# Patient Record
Sex: Female | Born: 1960 | Race: White | Hispanic: No | Marital: Single | Smoking: Never smoker
Health system: Southern US, Community
[De-identification: ages and names within clinical notes are randomized; demographics above are authoritative.]

## PROBLEM LIST (undated history)

## (undated) DIAGNOSIS — N200 Calculus of kidney: Secondary | ICD-10-CM

## (undated) DIAGNOSIS — I10 Essential (primary) hypertension: Secondary | ICD-10-CM

## (undated) DIAGNOSIS — L570 Actinic keratosis: Secondary | ICD-10-CM

## (undated) DIAGNOSIS — J302 Other seasonal allergic rhinitis: Secondary | ICD-10-CM

## (undated) DIAGNOSIS — E7800 Pure hypercholesterolemia, unspecified: Secondary | ICD-10-CM

## (undated) HISTORY — DX: Pure hypercholesterolemia, unspecified: E78.00

## (undated) HISTORY — DX: Actinic keratosis: L57.0

## (undated) HISTORY — DX: Other seasonal allergic rhinitis: J30.2

## (undated) HISTORY — DX: Calculus of kidney: N20.0

## (undated) HISTORY — PX: TONSILLECTOMY AND ADENOIDECTOMY: SUR1326

## (undated) HISTORY — DX: Essential (primary) hypertension: I10

## (undated) HISTORY — PX: OTHER SURGICAL HISTORY: SHX169

---

## 2008-05-06 ENCOUNTER — Ambulatory Visit: Payer: Self-pay | Admitting: Family Medicine

## 2009-10-09 ENCOUNTER — Ambulatory Visit: Payer: Self-pay | Admitting: Family Medicine

## 2011-04-29 DIAGNOSIS — I2510 Atherosclerotic heart disease of native coronary artery without angina pectoris: Secondary | ICD-10-CM | POA: Insufficient documentation

## 2011-04-29 DIAGNOSIS — E7800 Pure hypercholesterolemia, unspecified: Secondary | ICD-10-CM | POA: Insufficient documentation

## 2011-04-29 DIAGNOSIS — I10 Essential (primary) hypertension: Secondary | ICD-10-CM | POA: Insufficient documentation

## 2011-04-29 DIAGNOSIS — Z87442 Personal history of urinary calculi: Secondary | ICD-10-CM | POA: Insufficient documentation

## 2011-06-07 DIAGNOSIS — K760 Fatty (change of) liver, not elsewhere classified: Secondary | ICD-10-CM | POA: Insufficient documentation

## 2011-08-11 IMAGING — CR DG CHEST 2V
1 series · 2 of 2 positions shown · non-contrast
Comparison: none

REASON FOR EXAM: bronchitis
COMMENTS:

PROCEDURE:     KDR - KDXR CHEST PA (OR AP) AND LAT  - October 09, 2009  [DATE]
RESULT:     No acute cardiopulmonary disease.

[Series 1: view not recorded · 0.17mm/px · 2 of 2 slices shown]
[im 1/2]
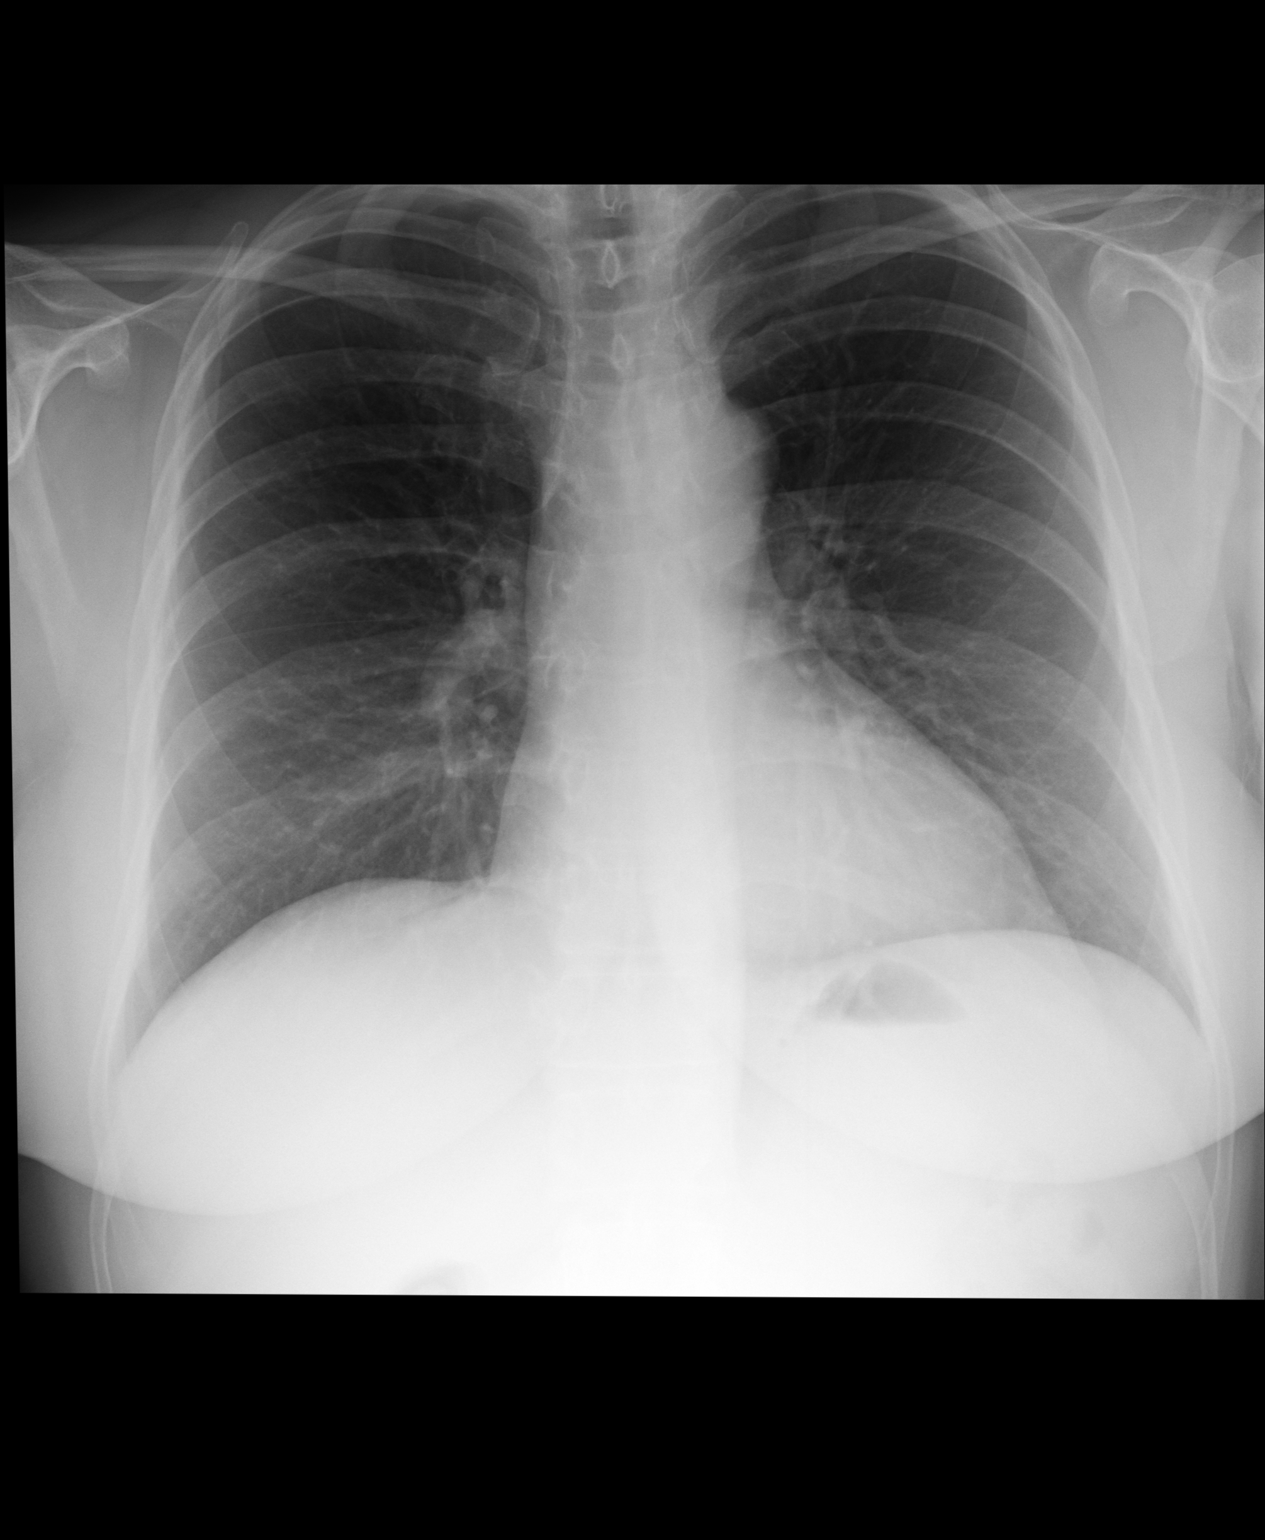
[im 2/2]
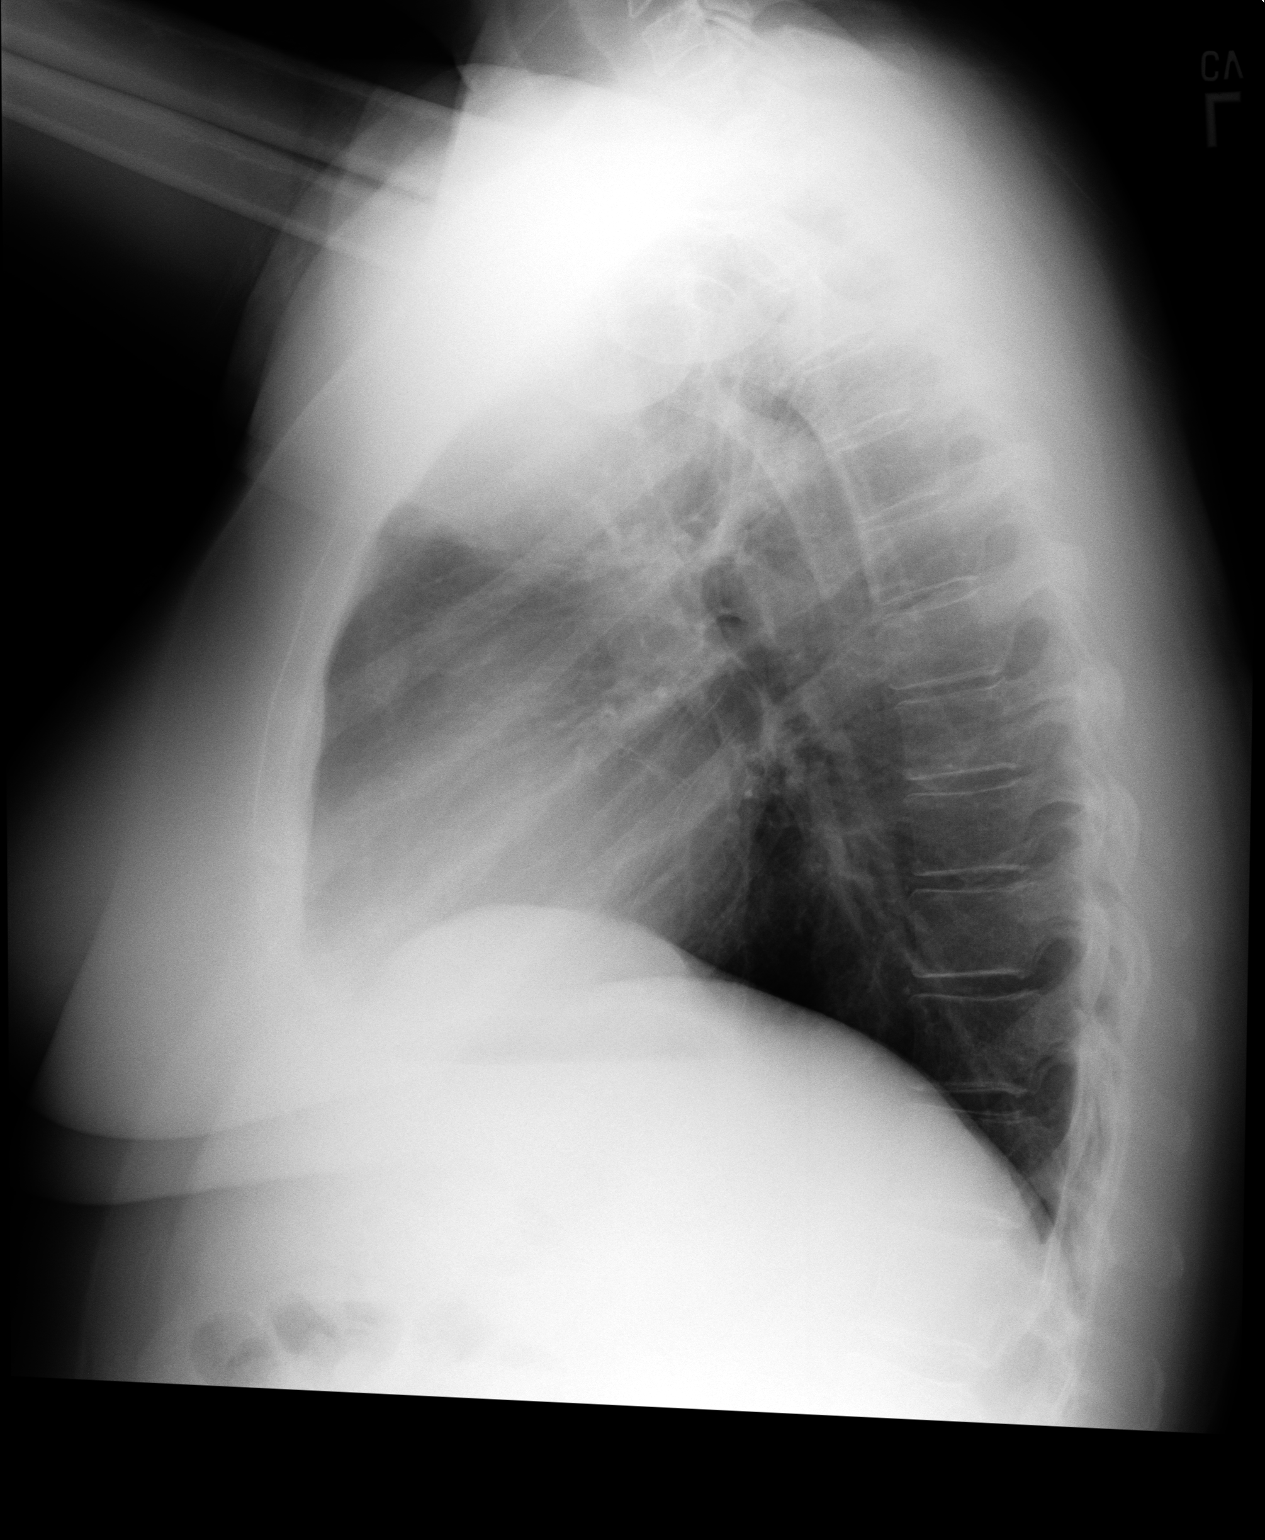

[2 of 2 positions shown; findings below may reference images not displayed]

IMPRESSION: No acute abnormality.

## 2012-04-27 DIAGNOSIS — M7710 Lateral epicondylitis, unspecified elbow: Secondary | ICD-10-CM | POA: Insufficient documentation

## 2012-12-11 ENCOUNTER — Encounter: Payer: Self-pay | Admitting: Internal Medicine

## 2013-11-26 ENCOUNTER — Ambulatory Visit (INDEPENDENT_AMBULATORY_CARE_PROVIDER_SITE_OTHER): Payer: BC Managed Care – PPO

## 2013-11-26 ENCOUNTER — Encounter: Payer: Self-pay | Admitting: Podiatry

## 2013-11-26 ENCOUNTER — Ambulatory Visit (INDEPENDENT_AMBULATORY_CARE_PROVIDER_SITE_OTHER): Payer: BC Managed Care – PPO | Admitting: Podiatry

## 2013-11-26 VITALS — Resp 16 | Ht 66.0 in | Wt 155.0 lb

## 2013-11-26 DIAGNOSIS — M6740 Ganglion, unspecified site: Secondary | ICD-10-CM

## 2013-11-26 DIAGNOSIS — M779 Enthesopathy, unspecified: Secondary | ICD-10-CM

## 2013-11-26 DIAGNOSIS — L02619 Cutaneous abscess of unspecified foot: Secondary | ICD-10-CM

## 2013-11-26 DIAGNOSIS — M79609 Pain in unspecified limb: Secondary | ICD-10-CM

## 2013-11-26 DIAGNOSIS — M79673 Pain in unspecified foot: Secondary | ICD-10-CM

## 2013-11-26 DIAGNOSIS — L03119 Cellulitis of unspecified part of limb: Secondary | ICD-10-CM

## 2013-11-26 MED ORDER — SULFAMETHOXAZOLE-TMP DS 800-160 MG PO TABS: 1.0000 | ORAL_TABLET | Freq: Two times a day (BID) | ORAL | Status: DC

## 2013-11-26 MED ORDER — CIPROFLOXACIN HCL 500 MG PO TABS: 500.0000 mg | ORAL_TABLET | Freq: Two times a day (BID) | ORAL | Status: DC

## 2013-11-26 NOTE — Progress Notes (Signed)
   Subjective:    Patient ID: Taylor Garrett, female    DOB: 22-Jan-1961, 53 y.o.   MRN: [id number]  HPI Comments: N pain L rt foot under 4th digit  D stepped on rake wed 4.8.15 O sudden C worse/better A standing, walking T peroxide, elevates, stay off of it, ibuprofen   N 0 L 3RD DIGIT LEFT D 1.5 -2 YRS O SLOWLY C WORSE A 0 T STUCK NEEDLE IN TOE   N PAIN L B/L FOOT PAIN-PLANTAR D 2 - 3 M O SLOWLY C WORSE A STANDING, BAREFOOT T IBUPROFEN, INSERTS  Foot Pain Associated symptoms include abdominal pain, coughing, fatigue and nausea.      Review of Systems  Constitutional: Positive for fatigue.  HENT: Negative.   Eyes: Negative.   Respiratory: Positive for cough, chest tightness and shortness of breath.   Cardiovascular: Negative.   Gastrointestinal: Positive for nausea, abdominal pain and constipation.       BLOATING  Endocrine:       BLOOD IN URINE  Musculoskeletal:       JOINT PAIN  Skin: Negative.   Allergic/Immunologic: Positive for environmental allergies.  Neurological: Negative.   Hematological: Bruises/bleeds easily.  Psychiatric/Behavioral: Negative.        Objective:   Physical Exam        Assessment & Plan:

## 2013-11-26 NOTE — Progress Notes (Signed)
Subjective:     Patient ID: Taylor Garrett, female   DOB: 04/07/1961, 53 y.o.   MRN: [id number]  Foot Pain   patient presents stating I stepped on a rake 2 days ago on my right foot and it went through my shoe and it penetrated the plantar of my foot right. States that she clean the area with hydrogen peroxide and has been sore since and she states that she has had her tetanus booster in the last 2 years. Also complains of a small mass on the left third toe and also a forefoot pain in general for which orthotics been helpful in the past and are no longer holding them up well   Review of Systems  All other systems reviewed and are negative.      Objective:   Physical Exam  Nursing note and vitals reviewed. Constitutional: She is oriented to person, place, and time.  Cardiovascular: Intact distal pulses.   Musculoskeletal: Normal range of motion.  Neurological: She is oriented to person, place, and time.  Skin: Skin is warm.   neurovascular found to be intact with muscle strength adequate and range of motion normal subtalar midtarsal joint. There is a small point of entry around the third metatarsal plantar right foot with no drainage noted and no significant edema erythema surrounding the area. There is some mild discomfort surrounding the area and is localized in nature. Small mass third digit left foot inner phalangeal joint which appears to be a cyst    Assessment:      no Indications of infection but is possible do to the introduction of foreign body to the plantar right foot. Left third toe shows a small cyst which appears to be mucoid and there is inflammation in the lesser MPJs consistent with capsulitis    Plan:     H&P and x-rays reviewed. For the right foot we reviewed x-ray and is precautionary measure I placed her on Septra DS and Cipro with strict instructions of increased redness any streaking border worker or increased pain to go straight to the emergency room. The left  proximal nerve block I then aspirated the area was able to get a small amount of gelatinous material and applied compression dressing. I then scanned for custom orthotics to reduce pressure against the plantar feet

## 2013-12-10 ENCOUNTER — Encounter: Payer: Self-pay | Admitting: Podiatry

## 2013-12-14 ENCOUNTER — Ambulatory Visit (INDEPENDENT_AMBULATORY_CARE_PROVIDER_SITE_OTHER): Payer: BC Managed Care – PPO | Admitting: Podiatry

## 2013-12-14 VITALS — Resp 16 | Ht 66.0 in | Wt 150.0 lb

## 2013-12-14 DIAGNOSIS — L02619 Cutaneous abscess of unspecified foot: Secondary | ICD-10-CM

## 2013-12-14 DIAGNOSIS — L923 Foreign body granuloma of the skin and subcutaneous tissue: Secondary | ICD-10-CM

## 2013-12-14 DIAGNOSIS — L03119 Cellulitis of unspecified part of limb: Principal | ICD-10-CM

## 2013-12-14 NOTE — Progress Notes (Signed)
Subjective:     Patient ID: Taylor Garrett, female   DOB: Nov 16, 1960, 53 y.o.   MRN: [id number]  HPI patient presents to pick up orthotics stating that there is a new spot on her right foot that she is concerned could be related to the infection she had had. Stated her forefoot is feeling much better but she only took a couple days of antibiotics as her mother passed away and she need to be on her foot  Review of Systems     Objective:   Physical Exam Vascular status intact with no change in health history and a small open area in the right proximal arch measuring approximately 2 x 2 mm with no erythema edema or drainage surrounding it    Assessment:     Seems to be a coincidence area does not appear to be involved with the original foreign body or infection of the forefoot right    Plan:     Orthotics dispensed with instructions and soaks will be done to the right foot with Band-Aid treatment and if any issues should occur or any redness swelling or pain patient is to inform us are go to the emergency room

## 2013-12-14 NOTE — Patient Instructions (Signed)

## 2014-01-07 ENCOUNTER — Ambulatory Visit: Payer: BC Managed Care – PPO | Admitting: Podiatry

## 2014-12-28 DIAGNOSIS — M170 Bilateral primary osteoarthritis of knee: Secondary | ICD-10-CM | POA: Insufficient documentation

## 2016-01-23 DIAGNOSIS — R1319 Other dysphagia: Secondary | ICD-10-CM | POA: Insufficient documentation

## 2016-01-23 DIAGNOSIS — R12 Heartburn: Secondary | ICD-10-CM | POA: Insufficient documentation

## 2016-02-01 DIAGNOSIS — Z789 Other specified health status: Secondary | ICD-10-CM | POA: Insufficient documentation

## 2016-04-02 ENCOUNTER — Ambulatory Visit (INDEPENDENT_AMBULATORY_CARE_PROVIDER_SITE_OTHER): Payer: BLUE CROSS/BLUE SHIELD

## 2016-04-02 ENCOUNTER — Encounter: Payer: Self-pay | Admitting: Podiatry

## 2016-04-02 ENCOUNTER — Ambulatory Visit (INDEPENDENT_AMBULATORY_CARE_PROVIDER_SITE_OTHER): Payer: BLUE CROSS/BLUE SHIELD | Admitting: Podiatry

## 2016-04-02 DIAGNOSIS — M779 Enthesopathy, unspecified: Secondary | ICD-10-CM

## 2016-04-02 DIAGNOSIS — M2021 Hallux rigidus, right foot: Secondary | ICD-10-CM | POA: Diagnosis not present

## 2016-04-02 DIAGNOSIS — M722 Plantar fascial fibromatosis: Secondary | ICD-10-CM | POA: Diagnosis not present

## 2016-04-02 DIAGNOSIS — M069 Rheumatoid arthritis, unspecified: Secondary | ICD-10-CM

## 2016-04-02 DIAGNOSIS — R52 Pain, unspecified: Secondary | ICD-10-CM

## 2016-04-02 MED ORDER — MELOXICAM 15 MG PO TABS: 15.0000 mg | ORAL_TABLET | Freq: Every day | ORAL | 2 refills | Status: DC

## 2016-04-02 NOTE — Progress Notes (Signed)
   Subjective:    Patient ID: Eloisa Northern, female    DOB: 1961-06-05, 55 y.o.   MRN: [id number]  HPI  55 year old female presents the office they for concerns of right foot pain along her big toe as well as her pain in the bottoms of both of her feet along the arch. She describes as an aching sensation to the bottom of her feet. She denies any recent injury or trauma. No swelling or redness. The pain does not wake her up in a. The majority of pain is when she is walking. No other complaints at this time.  Review of Systems  All other systems reviewed and are negative.      Objective:   Physical Exam General: AAO x3, NAD  Dermatological: Skin is warm, dry and supple bilateral. Nails x 10 are well manicured; remaining integument appears unremarkable at this time. There are no open sores, no preulcerative lesions, no rash or signs of infection present.  Vascular: Dorsalis Pedis artery and Posterior Tibial artery pedal pulses are 2/4 bilateral with immedate capillary fill time. Pedal hair growth present. There is no pain with calf compression, swelling, warmth, erythema.   Neruologic: Grossly intact via light touch bilateral. Vibratory intact via tuning fork bilateral. Protective threshold with Semmes Wienstein monofilament intact to all pedal sites bilateral.   Musculoskeletal: Decreased range of motion the right first MTPJ and is decreased range of motion the first MTPJ. There is trace edema along this area any erythema or increase in warmth. Mild discomfort on the plantar medial band of the plantar fascia and the arch of the feet bilaterally. There is no other areas of tenderness bilaterally. No specific area pinpoint bony tenderness or pain the vibratory sensation. MMT 5/5. Range of motion intact otherwise.      Assessment & Plan:  55 year old female right hallux rigidus, capsulitis; tendinitis -Treatment options discussed including all alternatives, risks, and  complications -Etiology of symptoms were discussed -X-rays were obtained and reviewed with the patient. 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 No evidence of acute fractures about this time. -Discussed a steroid injection of the right first MTPJ. She wishes to proceed. Under sterile conditions a mixture of dexamethasone and local anesthetic was infiltrated without complications. Post injection care was discussed. -Prescribed mobic. Discussed side effects of the medication and directed to stop if any are to occur and call the office.  -Stretching exercises discussed. -Ice to the area -In general she states that she has had multiple much stiffness to a lot of other joints as well as arthritis and should to get tested for arthritis. Ordered arthritic panel. 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 -Follow up as scheduled or sooner if needed.  Celesta Gentile, DPM

## 2016-04-10 ENCOUNTER — Other Ambulatory Visit: Payer: Self-pay | Admitting: Podiatry

## 2016-04-10 ENCOUNTER — Telehealth: Payer: Self-pay | Admitting: *Deleted

## 2016-04-10 ENCOUNTER — Ambulatory Visit: Payer: Self-pay | Admitting: Podiatry

## 2016-04-10 MED ORDER — METHYLPREDNISOLONE 4 MG PO TBPK: ORAL_TABLET | ORAL | 0 refills | Status: DC

## 2016-04-10 NOTE — Telephone Encounter (Addendum)
Pt states the Meloxicam is not really helping the pain in her feet and she wanted to know if Dr. Jacqualyn Posey would order a steroid pack. 04/11/2016-Informed pt the Medrol dose pack was at her pharmacy, and to stop the Mobic. Pt states she had stopped the Mobic days ago, and had begun Ibuprofen which works better for her, and she requested a rx for Ibuprofen 800mg , because she will be on vacation when she completes the dose pack. Dr. Jacqualyn Posey ordered Ibuprofen 800mg  #90 1 tablet tid, +2refills.

## 2016-04-10 NOTE — Telephone Encounter (Signed)
I went ahead and ordered this for her. Stop mobic.

## 2016-04-11 MED ORDER — IBUPROFEN 800 MG PO TABS: 800.0000 mg | ORAL_TABLET | Freq: Three times a day (TID) | ORAL | 2 refills | Status: AC | PRN

## 2019-12-02 ENCOUNTER — Ambulatory Visit: Payer: BC Managed Care – PPO | Admitting: Dermatology

## 2019-12-02 ENCOUNTER — Other Ambulatory Visit: Payer: Self-pay

## 2019-12-02 DIAGNOSIS — D1800 Hemangioma unspecified site: Secondary | ICD-10-CM

## 2019-12-02 DIAGNOSIS — D485 Neoplasm of uncertain behavior of skin: Secondary | ICD-10-CM | POA: Diagnosis not present

## 2019-12-02 DIAGNOSIS — M67472 Ganglion, left ankle and foot: Secondary | ICD-10-CM

## 2019-12-02 DIAGNOSIS — L570 Actinic keratosis: Secondary | ICD-10-CM

## 2019-12-02 DIAGNOSIS — D229 Melanocytic nevi, unspecified: Secondary | ICD-10-CM

## 2019-12-02 DIAGNOSIS — L820 Inflamed seborrheic keratosis: Secondary | ICD-10-CM | POA: Diagnosis not present

## 2019-12-02 DIAGNOSIS — Z872 Personal history of diseases of the skin and subcutaneous tissue: Secondary | ICD-10-CM

## 2019-12-02 DIAGNOSIS — D224 Melanocytic nevi of scalp and neck: Secondary | ICD-10-CM | POA: Diagnosis not present

## 2019-12-02 DIAGNOSIS — L821 Other seborrheic keratosis: Secondary | ICD-10-CM

## 2019-12-02 DIAGNOSIS — Z1283 Encounter for screening for malignant neoplasm of skin: Secondary | ICD-10-CM

## 2019-12-02 DIAGNOSIS — D489 Neoplasm of uncertain behavior, unspecified: Secondary | ICD-10-CM

## 2019-12-02 DIAGNOSIS — D2272 Melanocytic nevi of left lower limb, including hip: Secondary | ICD-10-CM

## 2019-12-02 DIAGNOSIS — L578 Other skin changes due to chronic exposure to nonionizing radiation: Secondary | ICD-10-CM

## 2019-12-02 DIAGNOSIS — M67449 Ganglion, unspecified hand: Secondary | ICD-10-CM

## 2019-12-02 DIAGNOSIS — I781 Nevus, non-neoplastic: Secondary | ICD-10-CM

## 2019-12-02 DIAGNOSIS — L814 Other melanin hyperpigmentation: Secondary | ICD-10-CM

## 2019-12-02 NOTE — Progress Notes (Signed)
Follow-Up Visit   Subjective  Taylor Garrett is a 59 y.o. female who presents for the following: Annual Exam (TBSE).  Patient is here for skin cancer screening and mole check.   She has itchy places on back all over, has some scaly places b/l legs, pink scaly place on left flank that is itch, and dark places on right leg on back of thigh about a year. They all tend to look scaly and dry.  The following portions of the chart were reviewed this encounter and updated as appropriate: Allergies  Meds  Problems  Med Hx  Surg Hx  Fam Hx     Review of Systems: No other skin or systemic complaints.  Objective  Well appearing patient in no apparent distress; mood and affect are within normal limits.  A full examination was performed including scalp, head, eyes, ears, nose, lips, neck, chest, axillae, abdomen, back, buttocks, bilateral upper extremities, bilateral lower extremities, hands, feet, fingers, toes, fingernails, and toenails. All findings within normal limits unless otherwise noted below.  Objective  Chest - Medial [facility]), Left  shin, Left Breast, Left Forearm - Anterior, Left Upper Back: Erythematous thin papules/macules with gritty scale. Erythematous thin papules/macules with gritty scale.   Objective  Left 3rd Toe: Translucent papule  Objective  Head - Anterior (Face), Left Upper Back: Right eyebrow clear No evidence of return Left upper back, clear No evidence of return  Objective  Right Lower Back: 0.4 cm red papule       Objective  Right Parietal Scalp: 0.7cm x 0.4 cm pink papule  Assessment & Plan  AK (actinic keratosis) (5) Left Forearm - Anterior; Left  shin; Chest - Medial [facility]); Left Breast; Left Upper Back  Destruction of lesion - Chest - Medial [facility]), Left  shin, Left Breast, Left Forearm - Anterior, Left Upper Back  Destruction method: cryotherapy   Informed consent: discussed and consent obtained   Lesion destroyed using  liquid nitrogen: Yes   Outcome: patient tolerated procedure well with no complications   Post-procedure details: wound care instructions given    Digital mucous cyst Left 3rd Toe  Benign-appearing.  Observation.     History of actinic keratoses (2) Head - Anterior (Face); Left Upper Back  Clear. Observe for recurrence. Call clinic for new or changing lesions.  Recommend regular skin exams, daily broad-spectrum spf 30+ sunscreen use, and photoprotection.     Inflamed seborrheic keratosis (5) Right Hip (side) - Posterior; Right Thigh - Posterior (2); Left Flank; Left Upper Back  Destruction of lesion - Left Flank, Left Upper Back, Right Hip (side) - Posterior, Right Thigh - Posterior (2)  Destruction method: cryotherapy   Informed consent: discussed and consent obtained   Lesion destroyed using liquid nitrogen: Yes   Outcome: patient tolerated procedure well with no complications   Post-procedure details: wound care instructions given    Neoplasm of uncertain behavior Right Lower Back  Epidermal / dermal shaving  Lesion length (cm):  0.4 Lesion width (cm):  0.4 Margin per side (cm):  0.1 Total excision diameter (cm):  0.6 Informed consent: discussed and consent obtained   Timeout: patient name, date of birth, surgical site, and procedure verified   Anesthesia: the lesion was anesthetized in a standard fashion   Anesthetic:  1% lidocaine w/ epinephrine 1-100,000 buffered w/ 8.4% NaHCO3 Instrument used: flexible razor blade   Outcome: patient tolerated procedure well   Post-procedure details: sterile dressing applied and wound care instructions given   Dressing  type: petrolatum and pressure dressing    Specimen 1 - Surgical pathology Differential Diagnosis: R/O irritated Hemangioma vs other Check Margins: No 0.4 cm red papule  R/O irritated Hemangioma vs other  Nevus Right Parietal Scalp  Benign-appearing.  Observation.  Call clinic for new or changing moles.   Recommend daily use of broad spectrum spf 30+ sunscreen to sun-exposed areas.   Actinic Damage - diffuse scaly erythematous macules with underlying dyspigmentation - Recommend daily broad spectrum sunscreen SPF 30+ to sun-exposed areas, reapply every 2 hours as needed.  - Call for new or changing lesions.  Lentigines - Scattered tan macules - Discussed due to sun exposure - Benign, observe - Call for any changes  Seborrheic Keratoses - Stuck-on, waxy, tan-brown papules and plaques  - Discussed benign etiology and prognosis. - Observe - Call for any changes  Hemangiomas - Red papules - Discussed benign nature - Observe - Call for any changes  Melanocytic Nevi - Tan-brown and/or pink-flesh-colored symmetric macules and papules - Benign appearing on exam today - Observation - Call clinic for new or changing moles - Recommend daily use of broad spectrum spf 30+ sunscreen to sun-exposed areas.  0.5cm thin brown papule, benign appearing left plantar ball of foot  Telangiectasia - left cheek - Dilated blood vessel - Benign appearing on exam - Call for changes  Skin cancer screening performed today.  Return in about 1 year (around 12/01/2020) for TBSE.   I, Donzetta Kohut, CMA, am acting as scribe for Forest Gleason, MD .  Documentation: I have reviewed the above documentation for accuracy and completeness, and I agree with the above.  Forest Gleason, MD

## 2019-12-02 NOTE — Patient Instructions (Addendum)
Liquid nitrogen was applied for 10-12 seconds to the skin lesion and the expected blistering or scabbing reaction explained. Do not pick at the area. Patient reminded to expect hypopigmented scars from the procedure. Return if lesion fails to fully resolve.  Recommend daily broad spectrum sunscreen SPF 30+ to sun-exposed areas, reapply every 2 hours as needed. Call for new or changing lesions.  Wound Care Instructions  1. Cleanse wound gently with soap and water once a day then pat dry with clean gauze. Apply a thing coat of Petrolatum (petroleum jelly, "Vaseline") over the wound (unless you have an allergy to this). We recommend that you use a new, sterile tube of Vaseline. Do not pick or remove scabs. Do not remove the yellow or white "healing tissue" from the base of the wound.  2. Cover the wound with fresh, clean, nonstick gauze and secure with paper tape. You may use Band-Aids in place of gauze and tape if the would is small enough, but would recommend trimming much of the tape off as there is often too much. Sometimes Band-Aids can irritate the skin.  3. You should call the office for your biopsy report after 1 week if you have not already been contacted.  4. If you experience any problems, such as abnormal amounts of bleeding, swelling, significant bruising, significant pain, or evidence of infection, please call the office immediately.  5. FOR ADULT SURGERY PATIENTS: If you need something for pain relief you may take 1 extra strength Tylenol (acetaminophen) AND 2 Ibuprofen (200mg  each) together every 4 hours as needed for pain. (do not take these if you are allergic to them or if you have a reason you should not take them.) Typically, you may only need pain medication for 1 to 3 days.

## 2019-12-08 ENCOUNTER — Telehealth: Payer: Self-pay

## 2019-12-08 NOTE — Telephone Encounter (Signed)
Patient was called and informed of biopsy results. Patient verbalized understanding.

## 2019-12-08 NOTE — Progress Notes (Signed)
Skin , right lower back HEMANGIOMA  This is a benign blood vessel growth. No additional treatment is needed.

## 2019-12-16 ENCOUNTER — Encounter: Payer: Self-pay | Admitting: Dermatology

## 2020-03-27 ENCOUNTER — Telehealth: Payer: Self-pay

## 2020-03-27 NOTE — Telephone Encounter (Signed)
Patient called to schedule a surgery with you. She states you have frozen a spot on the bottom of her right foot several times and states that if it returns problematic again you will need to do a little procedure on her?   I did not see anything per her last note but I did schedule her in a surgery slot for September 1st. I wanted you to be aware.

## 2020-03-28 NOTE — Telephone Encounter (Signed)
I reviewed patient's records in [place] and it looks like she has a history of a lesion that could be a corn, foreign body or verruca.  Please switch her to a regular clinic visit and I can biopsy at that visit if needed. Thank you!

## 2020-03-30 NOTE — Telephone Encounter (Signed)
Appointment has been fixed and patient made aware.

## 2020-04-06 ENCOUNTER — Other Ambulatory Visit: Payer: Self-pay

## 2020-04-06 ENCOUNTER — Ambulatory Visit: Payer: BC Managed Care – PPO | Admitting: Dermatology

## 2020-04-06 DIAGNOSIS — L814 Other melanin hyperpigmentation: Secondary | ICD-10-CM | POA: Diagnosis not present

## 2020-04-06 DIAGNOSIS — D492 Neoplasm of unspecified behavior of bone, soft tissue, and skin: Secondary | ICD-10-CM

## 2020-04-06 DIAGNOSIS — L820 Inflamed seborrheic keratosis: Secondary | ICD-10-CM

## 2020-04-06 DIAGNOSIS — D099 Carcinoma in situ, unspecified: Secondary | ICD-10-CM

## 2020-04-06 DIAGNOSIS — L821 Other seborrheic keratosis: Secondary | ICD-10-CM | POA: Diagnosis not present

## 2020-04-06 DIAGNOSIS — D485 Neoplasm of uncertain behavior of skin: Secondary | ICD-10-CM | POA: Diagnosis not present

## 2020-04-06 HISTORY — DX: Carcinoma in situ, unspecified: D09.9

## 2020-04-06 MED ORDER — MUPIROCIN 2 % EX OINT: 1.0000 "application " | TOPICAL_OINTMENT | Freq: Every day | CUTANEOUS | 0 refills | Status: DC

## 2020-04-06 MED ORDER — TRIAMCINOLONE ACETONIDE 0.1 % EX CREA: 1.0000 "application " | TOPICAL_CREAM | Freq: Two times a day (BID) | CUTANEOUS | 2 refills | Status: DC | PRN

## 2020-04-06 NOTE — Progress Notes (Signed)
   Follow-Up Visit   Subjective  Taylor Garrett "Taylor Garrett" is a 59 y.o. female who presents for the following: lesions (Neck, itching. ), Seborrheic Keratosis (back, itching, irritated), and Warts (right plantar foot, Hx of LN2 treatment).  The following portions of the chart were reviewed this encounter and updated as appropriate: Allergies  Meds  Problems  Med Hx  Surg Hx  Fam Hx      Review of Systems: No other skin or systemic complaints except as noted in HPI or Assessment and Plan.  Objective  Well appearing patient in no apparent distress; mood and affect are within normal limits.  A focused examination was performed including face, neck, back, right foot. Relevant physical exam findings are noted in the Assessment and Plan.  Objective  Back and shoulders x 21, neck x1 (22): Erythematous keratotic or waxy stuck-on papule or plaque.   Objective  Left Neck - Anterior: 0.4cm erythematous papule     Objective  Right Plantar Surface: 1.0cm thin scaly pink plaque     Assessment & Plan  Inflamed seborrheic keratosis (22) Back and shoulders x 21, neck x1  Destruction of lesion - Back and shoulders x 21, neck x1  Destruction method: cryotherapy   Informed consent: discussed and consent obtained   Lesion destroyed using liquid nitrogen: Yes   Outcome: patient tolerated procedure well with no complications   Post-procedure details: wound care instructions given    triamcinolone cream (KENALOG) 0.1 % - Back and shoulders x 21, neck x1  Neoplasm of skin (2) Left Neck - Anterior  Skin / nail biopsy Type of biopsy: tangential   Informed consent: discussed and consent obtained   Anesthesia: the lesion was anesthetized in a standard fashion   Anesthesia comment:  Area prepped with alcohol Anesthetic:  1% lidocaine w/ epinephrine 1-100,000 buffered w/ 8.4% NaHCO3 Instrument used: flexible razor blade   Hemostasis achieved with: pressure, aluminum chloride and  electrodesiccation   Outcome: patient tolerated procedure well   Post-procedure details: wound care instructions given   Post-procedure details comment:  Ointment and small bandage applied  Specimen 1 - Surgical pathology Differential Diagnosis: R/O irritated nevus vs ISK vs other Check Margins: No 0.4cm erythematous papule  Right Plantar Surface  Skin / nail biopsy Type of biopsy: tangential   Informed consent: discussed and consent obtained   Anesthesia: the lesion was anesthetized in a standard fashion   Anesthesia comment:  Area prepped with alcohol Anesthetic:  1% lidocaine w/ epinephrine 1-100,000 buffered w/ 8.4% NaHCO3 Instrument used: flexible razor blade   Hemostasis achieved with: pressure, aluminum chloride and electrodesiccation   Outcome: patient tolerated procedure well   Post-procedure details: wound care instructions given   Post-procedure details comment:  Ointment and small bandage applied  mupirocin ointment (BACTROBAN) 2 %  Specimen 2 - Surgical pathology Differential Diagnosis: R/O foreign body granuloma > verruca vs other Check Margins: No 1.0cm thin scaly pink plaque  Seborrheic Keratoses - Stuck-on, waxy, tan-brown papules and plaques  - Discussed benign etiology and prognosis. - Observe - Call for any changes  Lentigines - Scattered tan macules - Discussed due to sun exposure - Benign, observe - Call for any changes  Return for scheduled appointment.   I, Emelia Salisbury, CMA, am acting as scribe for Forest Gleason, MD.  Documentation: I have reviewed the above documentation for accuracy and completeness, and I agree with the above.  Forest Gleason, MD

## 2020-04-06 NOTE — Patient Instructions (Signed)
Wound Care Instructions  1. Cleanse wound gently with soap and water once a day then pat dry with clean gauze. Apply a thing coat of Petrolatum (petroleum jelly, "Vaseline") over the wound (unless you have an allergy to this). We recommend that you use a new, sterile tube of Vaseline. Do not pick or remove scabs. Do not remove the yellow or white "healing tissue" from the base of the wound.  2. Cover the wound with fresh, clean, nonstick gauze and secure with paper tape. You may use Band-Aids in place of gauze and tape if the would is small enough, but would recommend trimming much of the tape off as there is often too much. Sometimes Band-Aids can irritate the skin.  3. You should call the office for your biopsy report after 1 week if you have not already been contacted.  4. If you experience any problems, such as abnormal amounts of bleeding, swelling, significant bruising, significant pain, or evidence of infection, please call the office immediately.  5. FOR ADULT SURGERY PATIENTS: If you need something for pain relief you may take 1 extra strength Tylenol (acetaminophen) AND 2 Ibuprofen (200mg each) together every 4 hours as needed for pain. (do not take these if you are allergic to them or if you have a reason you should not take them.) Typically, you may only need pain medication for 1 to 3 days.   Cryotherapy Aftercare  . Wash gently with soap and water everyday.   . Apply Vaseline and Band-Aid daily until healed.  Prior to procedure, discussed risks of blister formation, small wound, skin dyspigmentation, or rare scar following cryotherapy.   

## 2020-04-13 ENCOUNTER — Encounter: Payer: Self-pay | Admitting: Dermatology

## 2020-04-13 NOTE — Progress Notes (Signed)
1. Skin , left neck - anterior SEBORRHEIC KERATOSIS, IRRITATED  2. 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 Will refer to Dr. Manley Mason at [facility] results and treatment with Mohs. Patient is in agreement.

## 2020-04-18 ENCOUNTER — Other Ambulatory Visit: Payer: Self-pay

## 2020-04-18 DIAGNOSIS — C44721 Squamous cell carcinoma of skin of unspecified lower limb, including hip: Secondary | ICD-10-CM

## 2020-04-18 NOTE — Progress Notes (Signed)
Referral to Dr. Manley Mason for [facility].

## 2020-04-19 ENCOUNTER — Encounter: Payer: BC Managed Care – PPO | Admitting: Dermatology

## 2020-04-20 ENCOUNTER — Telehealth: Payer: Self-pay

## 2020-04-20 NOTE — Telephone Encounter (Signed)
Pt called to check on status of her referral to [facility] for [facility] surgery. I informed pt that referral was placed and that we would check with coordinator for an update.

## 2020-04-26 ENCOUNTER — Ambulatory Visit: Payer: BC Managed Care – PPO | Admitting: Dermatology

## 2020-05-04 ENCOUNTER — Telehealth: Payer: Self-pay | Admitting: *Deleted

## 2020-05-04 NOTE — Telephone Encounter (Signed)
"  I'm calling to talk to someone about getting an office note from a previous visit.  I have to have some mole surgery on the bottom of one of my feet.  I want to get a note where I came in originally for a wound on my foot, just for the doctor.  Call and I will give you more information."

## 2020-05-12 NOTE — Telephone Encounter (Signed)
I am returning your call.  "I've already come over there and got my notes.  Cancer has showed up in a wound that you all had taken care of before.  I've already got it."

## 2020-07-27 ENCOUNTER — Ambulatory Visit: Payer: BC Managed Care – PPO | Admitting: Dermatology

## 2020-07-27 ENCOUNTER — Other Ambulatory Visit: Payer: Self-pay

## 2020-07-27 DIAGNOSIS — L820 Inflamed seborrheic keratosis: Secondary | ICD-10-CM

## 2020-07-27 DIAGNOSIS — D489 Neoplasm of uncertain behavior, unspecified: Secondary | ICD-10-CM

## 2020-07-27 DIAGNOSIS — D229 Melanocytic nevi, unspecified: Secondary | ICD-10-CM

## 2020-07-27 DIAGNOSIS — D224 Melanocytic nevi of scalp and neck: Secondary | ICD-10-CM

## 2020-07-27 DIAGNOSIS — Z1283 Encounter for screening for malignant neoplasm of skin: Secondary | ICD-10-CM

## 2020-07-27 DIAGNOSIS — B079 Viral wart, unspecified: Secondary | ICD-10-CM | POA: Diagnosis not present

## 2020-07-27 DIAGNOSIS — Z86007 Personal history of in-situ neoplasm of skin: Secondary | ICD-10-CM | POA: Diagnosis not present

## 2020-07-27 DIAGNOSIS — C44712 Basal cell carcinoma of skin of right lower limb, including hip: Secondary | ICD-10-CM | POA: Diagnosis not present

## 2020-07-27 DIAGNOSIS — L814 Other melanin hyperpigmentation: Secondary | ICD-10-CM

## 2020-07-27 DIAGNOSIS — L821 Other seborrheic keratosis: Secondary | ICD-10-CM

## 2020-07-27 DIAGNOSIS — D485 Neoplasm of uncertain behavior of skin: Secondary | ICD-10-CM | POA: Diagnosis not present

## 2020-07-27 DIAGNOSIS — L578 Other skin changes due to chronic exposure to nonionizing radiation: Secondary | ICD-10-CM

## 2020-07-27 DIAGNOSIS — D492 Neoplasm of unspecified behavior of bone, soft tissue, and skin: Secondary | ICD-10-CM

## 2020-07-27 DIAGNOSIS — D1800 Hemangioma unspecified site: Secondary | ICD-10-CM

## 2020-07-27 MED ORDER — MUPIROCIN 2 % EX OINT: 1.0000 "application " | TOPICAL_OINTMENT | Freq: Every day | CUTANEOUS | 0 refills | Status: DC

## 2020-07-27 NOTE — Patient Instructions (Addendum)
Recommend daily broad spectrum sunscreen SPF 30+ to sun-exposed areas, reapply every 2 hours as needed. Call for new or changing lesions.  Recommend taking Heliocare sun protection supplement daily in sunny weather for additional sun protection. For maximum protection on the sunniest days, you can take up to 2 capsules of regular Heliocare OR take 1 capsule of Heliocare Ultra. For prolonged exposure (such as a full day in the sun), you can repeat your dose of the supplement 4 hours after your first dose. Heliocare can be purchased at [facility] or at [email or web address].   Wound Care Instructions  1. Cleanse wound gently with soap and water once a day then pat dry with clean gauze. Apply a thing coat of Petrolatum (petroleum jelly, "Vaseline") over the wound (unless you have an allergy to this). We recommend that you use a new, sterile tube of Vaseline. Do not pick or remove scabs. Do not remove the yellow or white "healing tissue" from the base of the wound.  2. Cover the wound with fresh, clean, nonstick gauze and secure with paper tape. You may use Band-Aids in place of gauze and tape if the would is small enough, but would recommend trimming much of the tape off as there is often too much. Sometimes Band-Aids can irritate the skin.  3. You should call the office for your biopsy report after 1 week if you have not already been contacted.  4. If you experience any problems, such as abnormal amounts of bleeding, swelling, significant bruising, significant pain, or evidence of infection, please call the office immediately.  5. FOR ADULT SURGERY PATIENTS: If you need something for pain relief you may take 1 extra strength Tylenol (acetaminophen) AND 2 Ibuprofen (200mg each) together every 4 hours as needed for pain. (do not take these if you are allergic to them or if you have a reason you should not take them.) Typically, you may only need pain medication for 1 to 3 days.     

## 2020-07-27 NOTE — Progress Notes (Signed)
Follow-Up Visit   Subjective  Taylor Garrett is a 59 y.o. female who presents for the following: TBSE (Total body exam today. Hx of SCCIS, right plantar surface, tx with MOHs surgery. ) and Spot Check (Pt states that she has a red spot on her right hip that is scaly and itching occasionally. ).    The following portions of the chart were reviewed this encounter and updated as appropriate:      Review of Systems: No other skin or systemic complaints except as noted in HPI or Assessment and Plan.   Objective  Well appearing patient in no apparent distress; mood and affect are within normal limits.  A full examination was performed including scalp, head, eyes, ears, nose, lips, neck, chest, axillae, abdomen, back, buttocks, bilateral upper extremities, bilateral lower extremities, hands, feet, fingers, toes, fingernails, and toenails. All findings within normal limits unless otherwise noted below.  Objective  Right anterior hip: 0.8 cm scaly red papule R/o SCCIS       Objective  Mid Chest: 0.3 cm irregular dark brown macule       Objective  Right Lateral Foot: 0.7 cm scaly pink papule R/o SCC     Objective  Right parietal Scalp: 0.8 cm skin erythematous papule R/o irritated nevus VS atypia     Objective  Left Forearm - Posterior x 1, right anterior thigh x 3 (4): Verrucous papules  Objective  left upper arm x 1, right upper back x 1 (2): Erythematous keratotic or waxy stuck-on papule or plaque.   Assessment & Plan  Neoplasm of uncertain behavior of skin (4) Right anterior hip  Skin / nail biopsy Type of biopsy: tangential   Informed consent: discussed and consent obtained   Timeout: patient name, date of birth, surgical site, and procedure verified   Procedure prep:  Patient was prepped and draped in usual sterile fashion Prep type:  Isopropyl alcohol Anesthesia: the lesion was anesthetized in a standard fashion   Anesthetic:  1%  lidocaine w/ epinephrine 1-100,000 buffered w/ 8.4% NaHCO3 Instrument used: flexible razor blade   Hemostasis achieved with: pressure, aluminum chloride and electrodesiccation   Outcome: patient tolerated procedure well   Post-procedure details: sterile dressing applied and wound care instructions given   Dressing type: bandage and petrolatum    Specimen 3 - Surgical pathology Differential Diagnosis: R/o SCCIS  Check Margins: Yes 0.8 cm scaly red papule  Mid Chest  Epidermal / dermal shaving  Lesion diameter (cm):  0.3 Informed consent: discussed and consent obtained   Patient was prepped and draped in usual sterile fashion: Area prepped with alcohol. Anesthesia: the lesion was anesthetized in a standard fashion   Anesthetic:  1% lidocaine w/ epinephrine 1-100,000 buffered w/ 8.4% NaHCO3 Instrument used: flexible razor blade   Hemostasis achieved with: pressure, aluminum chloride and electrodesiccation   Outcome: patient tolerated procedure well   Post-procedure details: wound care instructions given   Post-procedure details comment:  Ointment and small bandage applied  Specimen 2 - Surgical pathology Differential Diagnosis: r/o atypia  Check Margins: No 0.3 cm irregular dark brown macule  Right Lateral Foot  Skin / nail biopsy Type of biopsy: tangential   Informed consent: discussed and consent obtained   Timeout: patient name, date of birth, surgical site, and procedure verified   Procedure prep:  Patient was prepped and draped in usual sterile fashion Prep type:  Isopropyl alcohol Anesthesia: the lesion was anesthetized in a standard fashion   Anesthetic:  1%  lidocaine w/ epinephrine 1-100,000 buffered w/ 8.4% NaHCO3 Instrument used: flexible razor blade   Hemostasis achieved with: pressure, aluminum chloride and electrodesiccation   Outcome: patient tolerated procedure well   Post-procedure details: sterile dressing applied and wound care instructions given    Dressing type: bandage and petrolatum    Specimen 4 - Surgical pathology Differential Diagnosis: R/o SCC  Check Margins: No 0.7 cm scaly pink papule   Right parietal Scalp  Epidermal / dermal shaving  Lesion diameter (cm):  0.8 Informed consent: discussed and consent obtained   Patient was prepped and draped in usual sterile fashion: Area prepped with alcohol. Anesthesia: the lesion was anesthetized in a standard fashion   Anesthetic:  1% lidocaine w/ epinephrine 1-100,000 buffered w/ 8.4% NaHCO3 Instrument used: flexible razor blade   Hemostasis achieved with: pressure, aluminum chloride and electrodesiccation   Outcome: patient tolerated procedure well   Post-procedure details: wound care instructions given   Post-procedure details comment:  Ointment and small bandage applied  Specimen 1 - Surgical pathology Differential Diagnosis: R/o irritated nevus VS atypia  Check Margins: Yes 0.8 cm skin erythematous papule  Viral warts, unspecified type (4) Left Forearm - Posterior x 1, right anterior thigh x 3  Prior to procedure, discussed risks of blister formation, small wound, skin dyspigmentation, or rare scar following cryotherapy.    Destruction of lesion - Left Forearm - Posterior x 1, right anterior thigh x 3  Destruction method: cryotherapy   Informed consent: discussed and consent obtained   Lesion destroyed using liquid nitrogen: Yes   Outcome: patient tolerated procedure well with no complications   Post-procedure details: wound care instructions given    Inflamed seborrheic keratosis (2) left upper arm x 1, right upper back x 1  Prior to procedure, discussed risks of blister formation, small wound, skin dyspigmentation, or rare scar following cryotherapy.    Destruction of lesion - left upper arm x 1, right upper back x 1  Destruction method: cryotherapy   Informed consent: discussed and consent obtained   Lesion destroyed using liquid nitrogen: Yes    Outcome: patient tolerated procedure well with no complications   Post-procedure details: wound care instructions given    Other Related Medications triamcinolone cream (KENALOG) 0.1 %  Neoplasm of skin  Reordered Medications mupirocin ointment (BACTROBAN) 2 %  Lentigines - Scattered tan macules - Discussed due to sun exposure - Benign, observe - Call for any changes  Seborrheic Keratoses - Stuck-on, waxy, tan-brown papules and plaques  - Discussed benign etiology and prognosis. - Observe - Call for any changes  Melanocytic Nevi - Tan-brown and/or pink-flesh-colored symmetric macules and papules - Benign appearing on exam today - Observation - Call clinic for new or changing moles - Recommend daily use of broad spectrum spf 30+ sunscreen to sun-exposed areas.   Hemangiomas - Red papules - Discussed benign nature - Observe - Call for any changes  Actinic Damage - Severe, chronic, secondary to cumulative UV radiation exposure over time - diffuse scaly erythematous macules and papules with underlying dyspigmentation - Discussed "Field Treatment" for Severe, Confluent Actinic Changes with Pre-Cancerous Actinic Keratoses Field treatment involves treatment of an entire area of skin that has confluent Actinic Changes (Sun/ Ultraviolet light damage) and PreCancerous Actinic Keratoses by method of PhotoDynamic Therapy (PDT) and/or prescription Topical Chemotherapy agents such as 5-fluorouracil, 5-fluorouracil/calcipotriene, and/or imiquimod.  The purpose is to decrease the number of clinically evident and subclinical PreCancerous lesions to prevent progression to development of skin cancer by  chemically destroying early precancer changes that may or may not be visible.  It has been shown to reduce the risk of developing skin cancer in the treated area. As a result of treatment, redness, scaling, crusting, and open sores may occur during treatment course. One or more than one of these  methods may be used and may have to be used several times to control, suppress and eliminate the PreCancerous changes. Discussed treatment course, expected reaction, and possible side effects. - Recommend daily broad spectrum sunscreen SPF 30+ to sun-exposed areas, reapply every 2 hours as needed.  - Call for new or changing lesions.   Skin cancer screening performed today.  History of Squamous Cell Carcinoma in Situ of the Skin - No evidence of recurrence today - Recommend regular full body skin exams - Recommend daily broad spectrum sunscreen SPF 30+ to sun-exposed areas, reapply every 2 hours as needed.  - Call if any new or changing lesions are noted between office visits   Return in about 1 month (around 08/27/2020) for PDT chest, 1 mo f/u after PDT, 6 Mo TBSE.   I, Harriett Sine, CMA, am acting as scribe for Forest Gleason, MD.  Documentation: I have reviewed the above documentation for accuracy and completeness, and I agree with the above.  Forest Gleason, MD

## 2020-08-01 NOTE — Progress Notes (Signed)
1. Skin , right parietal scalp MELANOCYTIC NEVUS, INTRADERMAL TYPE  This is a NORMAL MOLE. No additional treatment is needed.   2. Skin , mid chest ACTINIC KERATOSIS AND SOLAR LENTIGO --> LN2  3. Skin , right anterior hip LICHENOID ACTINIC KERATOSIS --> LN2  4. Skin , right lateral foot SUPERFICIAL BASAL CELL CARCINOMA --> ED&C  Dr. Laurence Ferrari reviewed with patient on 08/01/2020. Will plan ED&C and LN2. MAs please schedule for appointment for [facility], LN2 and f/u PDT on February 8th. Thank you!

## 2020-08-23 ENCOUNTER — Telehealth: Payer: Self-pay

## 2020-08-23 NOTE — Telephone Encounter (Signed)
-----   Message from Virginia Moye, MD sent at 08/01/2020  3:23 PM EST ----- 1. Skin , right parietal scalp MELANOCYTIC NEVUS, INTRADERMAL TYPE  This is a NORMAL MOLE. No additional treatment is needed.   2. Skin , mid chest ACTINIC KERATOSIS AND SOLAR LENTIGO --> LN2  3. Skin , right anterior hip LICHENOID ACTINIC KERATOSIS --> LN2  4. Skin , right lateral foot SUPERFICIAL BASAL CELL CARCINOMA --> ED&C  Dr. Moye reviewed with patient on 08/01/2020. Will plan ED&C and LN2. MAs please schedule for appointment for ED&C, LN2 and f/u PDT on February 8th. Thank you!   

## 2020-08-23 NOTE — Telephone Encounter (Signed)
Pt will be in the office Monday for PDT and she will schedule return appt for treatment for areas that was biopsied 07/27/2020

## 2020-08-23 NOTE — Telephone Encounter (Signed)
Left another msg for patient to call office to review pathology, JS

## 2020-08-28 ENCOUNTER — Other Ambulatory Visit: Payer: Self-pay

## 2020-08-28 ENCOUNTER — Ambulatory Visit: Payer: BC Managed Care – PPO

## 2020-08-28 ENCOUNTER — Telehealth: Payer: Self-pay

## 2020-08-28 DIAGNOSIS — L570 Actinic keratosis: Secondary | ICD-10-CM

## 2020-08-28 MED ORDER — AMINOLEVULINIC ACID HCL 20 % EX SOLR
1.0000 | Freq: Once | CUTANEOUS | Status: AC
Start: 2020-08-28 — End: 2020-08-28
  Administered 2020-08-28: 354 mg via TOPICAL

## 2020-08-28 NOTE — Telephone Encounter (Signed)
Patient advised of BX results by phone by Dr. Laurence Ferrari. She has been scheduled for 09/26/20 per Dr. Laurence Ferrari.

## 2020-08-28 NOTE — Progress Notes (Signed)
Patient completed PDT therapy today.  1. AK (actinic keratosis) Chest  Photodynamic therapy - Chest Procedure discussed: discussed risks, benefits, side effects. and alternatives   Prep: site scrubbed/prepped with acetone   Location:  Chest Number of lesions:  Multiple Type of treatment:  Blue light Aminolevulinic Acid (see MAR for details): Levulan Number of Levulan sticks used:  1 Incubation time (minutes):  120 Number of minutes under lamp:  16 Number of seconds under lamp:  40 Cooling:  Floor fan Outcome: patient tolerated procedure well with no complications   Post-procedure details: sunscreen applied    Aminolevulinic Acid HCl 20 % SOLR 354 mg - Chest

## 2020-08-28 NOTE — Telephone Encounter (Signed)
-----   Message from Alfonso Patten, MD sent at 08/01/2020  3:23 PM EST ----- 1. Skin , right parietal scalp MELANOCYTIC NEVUS, INTRADERMAL TYPE  This is a NORMAL MOLE. No additional treatment is needed.   2. Skin , mid chest ACTINIC KERATOSIS AND SOLAR LENTIGO --> LN2  3. Skin , right anterior hip LICHENOID ACTINIC KERATOSIS --> LN2  4. Skin , right lateral foot SUPERFICIAL BASAL CELL CARCINOMA --> ED&C  Dr. Laurence Ferrari reviewed with patient on 08/01/2020. Will plan ED&C and LN2. MAs please schedule for appointment for [facility], LN2 and f/u PDT on February 8th. Thank you!

## 2020-08-28 NOTE — Patient Instructions (Signed)

## 2020-09-26 ENCOUNTER — Ambulatory Visit: Payer: BC Managed Care – PPO | Admitting: Dermatology

## 2020-09-26 ENCOUNTER — Other Ambulatory Visit: Payer: Self-pay | Admitting: Dermatology

## 2020-09-26 ENCOUNTER — Other Ambulatory Visit: Payer: Self-pay

## 2020-09-26 DIAGNOSIS — L578 Other skin changes due to chronic exposure to nonionizing radiation: Secondary | ICD-10-CM

## 2020-09-26 DIAGNOSIS — D485 Neoplasm of uncertain behavior of skin: Secondary | ICD-10-CM

## 2020-09-26 DIAGNOSIS — C44712 Basal cell carcinoma of skin of right lower limb, including hip: Secondary | ICD-10-CM | POA: Diagnosis not present

## 2020-09-26 DIAGNOSIS — L570 Actinic keratosis: Secondary | ICD-10-CM | POA: Diagnosis not present

## 2020-09-26 DIAGNOSIS — C4491 Basal cell carcinoma of skin, unspecified: Secondary | ICD-10-CM

## 2020-09-26 HISTORY — DX: Basal cell carcinoma of skin, unspecified: C44.91

## 2020-09-26 NOTE — Patient Instructions (Addendum)
Wound Care Instructions  1. Cleanse wound gently with soap and water once a day then pat dry with clean gauze. Apply a thing coat of Petrolatum (petroleum jelly, "Vaseline") over the wound (unless you have an allergy to this). We recommend that you use a new, sterile tube of Vaseline. Do not pick or remove scabs. Do not remove the yellow or white "healing tissue" from the base of the wound.  2. Cover the wound with fresh, clean, nonstick gauze and secure with paper tape. You may use Band-Aids in place of gauze and tape if the would is small enough, but would recommend trimming much of the tape off as there is often too much. Sometimes Band-Aids can irritate the skin.  3. You should call the office for your biopsy report after 1 week if you have not already been contacted.  4. If you experience any problems, such as abnormal amounts of bleeding, swelling, significant bruising, significant pain, or evidence of infection, please call the office immediately.  5. FOR ADULT SURGERY PATIENTS: If you need something for pain relief you may take 1 extra strength Tylenol (acetaminophen) AND 2 Ibuprofen (200mg  each) together every 4 hours as needed for pain. (do not take these if you are allergic to them or if you have a reason you should not take them.) Typically, you may only need pain medication for 1 to 3 days.   Cryotherapy Aftercare  . Wash gently with soap and water everyday.   Marland Kitchen Apply Vaseline and Band-Aid daily until healed.  Prior to procedure, discussed risks of blister formation, small wound, skin dyspigmentation, or rare scar following cryotherapy.

## 2020-09-26 NOTE — Progress Notes (Signed)
Follow-Up Visit   Subjective  Taylor Garrett is a 60 y.o. female who presents for the following: Procedure (Patient here today for LN2 to bx proven AK's at right anterior hip and mid chest. Also for [facility] at right lateral foot for bx proven superficial BCC.).  The following portions of the chart were reviewed this encounter and updated as appropriate:   Allergies  Meds  Problems  Med Hx  Surg Hx  Fam Hx      Review of Systems:  No other skin or systemic complaints except as noted in HPI or Assessment and Plan.  Objective  Well appearing patient in no apparent distress; mood and affect are within normal limits.  A focused examination was performed including chest, right hip, right foot. Relevant physical exam findings are noted in the Assessment and Plan.  Objective  Right Lateral Foot: Pink papule  Objective  Right hip: Erythematous thin papules/macules with gritty scale.   Objective  mid chest: 0.6cm pink papule      Assessment & Plan  Basal cell carcinoma (BCC), unspecified site Right Lateral Foot  Destruction of lesion  Destruction method: electrodesiccation and curettage   Informed consent: discussed and consent obtained   Timeout:  patient name, date of birth, surgical site, and procedure verified Patient was prepped and draped in usual sterile fashion: area prepped with isopropyl alcohol. Anesthesia: the lesion was anesthetized in a standard fashion   Anesthetic:  1% lidocaine w/ epinephrine 1-100,000 buffered w/ 8.4% NaHCO3 Curettage performed in three different directions: Yes   Electrodesiccation performed over the curetted area: Yes   Curettage cycles:  3 Final wound size (cm):  1.5 Hemostasis achieved with:  electrodesiccation Outcome: patient tolerated procedure well with no complications   Post-procedure details: wound care instructions given   Additional details:  Mupirocin and a pressure dressing applied  AK (actinic  keratosis) Right hip  Bx proven at right hip  Prior to procedure, discussed risks of blister formation, small wound, skin dyspigmentation, or rare scar following cryotherapy.    Destruction of lesion - Right hip Complexity: simple   Destruction method: cryotherapy   Informed consent: discussed and consent obtained   Lesion destroyed using liquid nitrogen: Yes   Cryotherapy cycles:  2 Outcome: patient tolerated procedure well with no complications   Post-procedure details: wound care instructions given    Neoplasm of uncertain behavior of skin mid chest  Skin / nail biopsy Type of biopsy: tangential   Informed consent: discussed and consent obtained   Timeout: patient name, date of birth, surgical site, and procedure verified   Patient was prepped and draped in usual sterile fashion: Area prepped with isopropyl alcohol. Anesthesia: the lesion was anesthetized in a standard fashion   Anesthetic:  1% lidocaine w/ epinephrine 1-100,000 buffered w/ 8.4% NaHCO3 Instrument used: flexible razor blade   Hemostasis achieved with: aluminum chloride   Outcome: patient tolerated procedure well   Post-procedure details: wound care instructions given   Additional details:  Mupirocin and a bandage applied  Specimen 1 - Surgical pathology Differential Diagnosis: r/o BCC  Check Margins: No 0.6cm pink papule  Seborrheic Keratoses - Stuck-on, waxy, tan-brown papules and plaques  - Discussed benign etiology and prognosis. - Observe - Call for any changes  Actinic Damage - Severe, chronic, secondary to cumulative UV radiation exposure over time - diffuse scaly erythematous macules and papules with underlying dyspigmentation - Discussed Prescription "Field Treatment" for Severe, Chronic Confluent Actinic Changes with Pre-Cancerous Actinic Keratoses Field  treatment involves treatment of an entire area of skin that has confluent Actinic Changes (Sun/ Ultraviolet light damage) and PreCancerous  Actinic Keratoses by method of PhotoDynamic Therapy (PDT) and/or prescription Topical Chemotherapy agents such as 5-fluorouracil, 5-fluorouracil/calcipotriene, and/or imiquimod.  The purpose is to decrease the number of clinically evident and subclinical PreCancerous lesions to prevent progression to development of skin cancer by chemically destroying early precancer changes that may or may not be visible.  It has been shown to reduce the risk of developing skin cancer in the treated area. As a result of treatment, redness, scaling, crusting, and open sores may occur during treatment course. One or more than one of these methods may be used and may have to be used several times to control, suppress and eliminate the PreCancerous changes. Discussed treatment course, expected reaction, and possible side effects. - Recommend daily broad spectrum sunscreen SPF 30+ to sun-exposed areas, reapply every 2 hours as needed.  - Call for new or changing lesions. - Will plan additional round of PDT to chest   Return for TBSE as scheduled, PDT for chest/6 weeks.  I, Taylor Garrett, RMA, scribed for Taylor Patten, MD.  Documentation: I have reviewed the above documentation for accuracy and completeness, and I agree with the above.  Taylor Gleason, MD

## 2020-10-02 ENCOUNTER — Telehealth: Payer: Self-pay

## 2020-10-02 NOTE — Telephone Encounter (Signed)
-----   Message from Alfonso Patten, MD sent at 10/02/2020  4:08 PM EST ----- Skin , mid chest DERMAL SCAR --> NTD, no evidence of skin cancer  MAs please call. Thank you!

## 2020-10-02 NOTE — Telephone Encounter (Signed)
Patient advised bx benign dermal scar, JS

## 2020-10-03 ENCOUNTER — Ambulatory Visit: Payer: BC Managed Care – PPO | Admitting: Dermatology

## 2020-10-23 ENCOUNTER — Telehealth: Payer: Self-pay

## 2020-10-23 NOTE — Telephone Encounter (Signed)
Patient called regarding place on foot where EDC was done last month. Patient states this area is having a very hard time healing. She stated she thinks it could possibly be getting infected as the area is very red and tender to touch. Patient complains of causing issues sleeping during the last several nights. Please advise.   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

## 2020-10-24 MED ORDER — DOXYCYCLINE MONOHYDRATE 100 MG PO CAPS: 100.0000 mg | ORAL_CAPSULE | Freq: Two times a day (BID) | ORAL | 0 refills | Status: AC

## 2020-10-24 MED ORDER — MUPIROCIN 2 % EX OINT: 1.0000 "application " | TOPICAL_OINTMENT | Freq: Every day | CUTANEOUS | 0 refills | Status: AC

## 2020-10-24 NOTE — Telephone Encounter (Signed)
Left message for patient to return my call.  Prescriptions sent in for patient.

## 2020-10-24 NOTE — Telephone Encounter (Signed)
Patient advised of information. She is unable to come in for an appointment. Patient will pick up prescriptions.

## 2020-10-24 NOTE — Telephone Encounter (Signed)
Ideally would check this in person tomorrow if she is available (8 am or 12:45 pm ok). If not, ok to prescribe doxycycline monohydrate capsules 100 mg take twice daily with food for 7 days. Recommend mupirocin ointment followed by bandaid once a day and wearing compression stockings over bandage to help with swelling as swelling can slow down healing of lower leg wounds quite a bit. Thank you  Doxycycline should be taken with food to prevent nausea. Do not lay down for 30 minutes after taking. Be cautious with sun exposure and use good sun protection while on this medication.

## 2020-11-09 ENCOUNTER — Other Ambulatory Visit: Payer: Self-pay

## 2020-11-09 ENCOUNTER — Ambulatory Visit (INDEPENDENT_AMBULATORY_CARE_PROVIDER_SITE_OTHER): Payer: BC Managed Care – PPO

## 2020-11-09 DIAGNOSIS — L570 Actinic keratosis: Secondary | ICD-10-CM | POA: Diagnosis not present

## 2020-11-09 MED ORDER — AMINOLEVULINIC ACID HCL 20 % EX SOLR
1.0000 "application " | Freq: Once | CUTANEOUS | Status: AC
  Administered 2020-11-09: 354 mg via TOPICAL

## 2020-11-09 NOTE — Progress Notes (Signed)
Patient completed PDT therapy today.  1. AK (actinic keratosis) (3) Chest - Medial [facility]); Left Breast; Right Breast  Photodynamic therapy - Chest - Medial [facility]), Left Breast, Right Breast Procedure discussed: discussed risks, benefits, side effects. and alternatives   Prep: site scrubbed/prepped with acetone   Location:  Chest Number of lesions:  Multiple Type of treatment:  Blue light Aminolevulinic Acid (see MAR for details): Levulan Number of Levulan sticks used:  1 Incubation time (minutes):  120 Number of minutes under lamp:  16 Number of seconds under lamp:  40 Cooling:  Floor fan Outcome: patient tolerated procedure well with no complications   Post-procedure details: sunscreen applied    Aminolevulinic Acid HCl 20 % SOLR 354 mg - Chest - Medial (Center), Left Breast, Right Breast

## 2020-11-09 NOTE — Patient Instructions (Signed)

## 2020-12-28 ENCOUNTER — Other Ambulatory Visit: Payer: Self-pay

## 2020-12-28 ENCOUNTER — Ambulatory Visit (INDEPENDENT_AMBULATORY_CARE_PROVIDER_SITE_OTHER): Payer: BC Managed Care – PPO | Admitting: Dermatology

## 2020-12-28 DIAGNOSIS — D1800 Hemangioma unspecified site: Secondary | ICD-10-CM

## 2020-12-28 DIAGNOSIS — L814 Other melanin hyperpigmentation: Secondary | ICD-10-CM

## 2020-12-28 DIAGNOSIS — L905 Scar conditions and fibrosis of skin: Secondary | ICD-10-CM | POA: Diagnosis not present

## 2020-12-28 DIAGNOSIS — Z86007 Personal history of in-situ neoplasm of skin: Secondary | ICD-10-CM | POA: Diagnosis not present

## 2020-12-28 DIAGNOSIS — Z872 Personal history of diseases of the skin and subcutaneous tissue: Secondary | ICD-10-CM | POA: Diagnosis not present

## 2020-12-28 DIAGNOSIS — D229 Melanocytic nevi, unspecified: Secondary | ICD-10-CM

## 2020-12-28 DIAGNOSIS — Z85828 Personal history of other malignant neoplasm of skin: Secondary | ICD-10-CM

## 2020-12-28 DIAGNOSIS — L578 Other skin changes due to chronic exposure to nonionizing radiation: Secondary | ICD-10-CM

## 2020-12-28 DIAGNOSIS — L820 Inflamed seborrheic keratosis: Secondary | ICD-10-CM

## 2020-12-28 DIAGNOSIS — L570 Actinic keratosis: Secondary | ICD-10-CM

## 2020-12-28 DIAGNOSIS — L821 Other seborrheic keratosis: Secondary | ICD-10-CM

## 2020-12-28 DIAGNOSIS — Z1283 Encounter for screening for malignant neoplasm of skin: Secondary | ICD-10-CM | POA: Diagnosis not present

## 2020-12-28 MED ORDER — NIACINAMIDE ER 500 MG PO TBCR: 1.0000 | EXTENDED_RELEASE_TABLET | Freq: Two times a day (BID) | ORAL | 11 refills | Status: AC

## 2020-12-28 NOTE — Progress Notes (Signed)
Follow-Up Visit   Subjective  Taylor Garrett is a 60 y.o. female who presents for the following: FBSE (Patient here for full body skin exam and skin cancer screening. Patient with hx of SCC, BCC and AK's. Patient does have a few places at chest that sometimes itch and are sometimes tender. ).  The following portions of the chart were reviewed this encounter and updated as appropriate:   Allergies  Meds  Problems  Med Hx  Surg Hx  Fam Hx      Review of Systems:  No other skin or systemic complaints except as noted in HPI or Assessment and Plan.  Objective  Well appearing patient in no apparent distress; mood and affect are within normal limits.  A full examination was performed including scalp, head, eyes, ears, nose, lips, neck, chest, axillae, abdomen, back, buttocks, bilateral upper extremities, bilateral lower extremities, hands, feet, fingers, toes, fingernails, and toenails. All findings within normal limits unless otherwise noted below.  Objective  mid chest: Erythematous thin papules/macules with gritty scale.   Objective  Left chest x 1, left thigh x 1 (2): Erythematous keratotic or waxy stuck-on papule or plaque.   Objective  Right Dorsum of Foot: Dyspigmented smooth macule or patch.    Assessment & Plan  AK (actinic keratosis) mid chest  Prior to procedure, discussed risks of blister formation, small wound, skin dyspigmentation, or rare scar following cryotherapy.    Destruction of lesion - mid chest  Destruction method: cryotherapy   Informed consent: discussed and consent obtained   Lesion destroyed using liquid nitrogen: Yes   Cryotherapy cycles:  2 Outcome: patient tolerated procedure well with no complications   Post-procedure details: wound care instructions given    Inflamed seborrheic keratosis (2) Left chest x 1, left thigh x 1  Prior to procedure, discussed risks of blister formation, small wound, skin dyspigmentation, or rare  scar following cryotherapy.    Destruction of lesion - Left chest x 1, left thigh x 1  Destruction method: cryotherapy   Informed consent: discussed and consent obtained   Lesion destroyed using liquid nitrogen: Yes   Cryotherapy cycles:  2 Outcome: patient tolerated procedure well with no complications   Post-procedure details: wound care instructions given    Other Related Medications triamcinolone cream (KENALOG) 0.1 %  Scar Right Dorsum of Foot  Hypertrophic, symptomatic  Hx BCC, s/p EDC  Intralesional injection - Right Dorsum of Foot Location: right dorsum foot  Informed Consent: Discussed risks (infection, pain, bleeding, bruising, thinning of the skin, loss of skin pigment, lack of resolution, and recurrence of lesion) and benefits of the procedure, as well as the alternatives. Informed consent was obtained. Preparation: The area was prepared a standard fashion.  Procedure Details: An intralesional injection was performed with Kenalog 7.5 mg/cc. 0.2 cc in total were injected.  Total number of injections: >5  Plan: The patient was instructed on post-op care. Recommend OTC analgesia as needed for pain.    Lentigines - Scattered tan macules - Due to sun exposure - Benign-appering, observe - Recommend daily broad spectrum sunscreen SPF 30+ to sun-exposed areas, reapply every 2 hours as needed. - Call for any changes  Seborrheic Keratoses - Stuck-on, waxy, tan-brown papules and/or plaques  - Benign-appearing - Discussed benign etiology and prognosis. - Observe - Call for any changes  Melanocytic Nevi - Tan-brown and/or pink-flesh-colored symmetric macules and papules - Benign appearing on exam today - Observation - Call clinic for new or changing moles -  Recommend daily use of broad spectrum spf 30+ sunscreen to sun-exposed areas.   Hemangiomas - Red papules - Discussed benign nature - Observe - Call for any changes  Actinic Damage - Chronic condition,  secondary to cumulative UV/sun exposure - diffuse scaly erythematous macules with underlying dyspigmentation - Recommend daily broad spectrum sunscreen SPF 30+ to sun-exposed areas, reapply every 2 hours as needed.  - Staying in the shade or wearing long sleeves, sun glasses (UVA+UVB protection) and wide brim hats (4-inch brim around the entire circumference of the hat) are also recommended for sun protection.  - Call for new or changing lesions.  Skin cancer screening performed today.  History of Basal Cell Carcinoma of the Skin - No evidence of recurrence today - Recommend regular full body skin exams - Recommend daily broad spectrum sunscreen SPF 30+ to sun-exposed areas, reapply every 2 hours as needed.  - Call if any new or changing lesions are noted between office visits  History of PreCancerous Actinic Keratosis  - site(s) of PreCancerous Actinic Keratosis clear today. - these may recur and new lesions may form requiring treatment to prevent transformation into skin cancer - observe for new or changing spots and contact [place] for appointment if occur - photoprotection with sun protective clothing; sunglasses and broad spectrum sunscreen with SPF of at least 30 + and frequent self skin exams recommended - yearly exams by a dermatologist recommended for persons with history of PreCancerous Actinic Keratoses  History of Squamous Cell Carcinoma in Situ of the Skin - No evidence of recurrence today - Recommend regular full body skin exams - Recommend daily broad spectrum sunscreen SPF 30+ to sun-exposed areas, reapply every 2 hours as needed.  - Call if any new or changing lesions are noted between office visits   Return in about 6 months (around 06/30/2021) for TBSE.  Graciella Belton, RMA, am acting as scribe for Forest Gleason, MD .  Documentation: I have reviewed the above documentation for accuracy and completeness, and I agree with the above.  Forest Gleason,  MD

## 2020-12-28 NOTE — Patient Instructions (Addendum)
Cryotherapy Aftercare  . Wash gently with soap and water everyday.   Marland Kitchen Apply Vaseline and Band-Aid daily until healed.  Prior to procedure, discussed risks of blister formation, small wound, skin dyspigmentation, or rare scar following cryotherapy.   Recommend Niacinamide or Nicotinamide 500mg  twice per day to lower risk of non-melanoma skin cancer by approximately 25%. Usually available at [employer].  Recommend taking Heliocare sun protection supplement daily in sunny weather for additional sun protection. For maximum protection on the sunniest days, you can take up to 2 capsules of regular Heliocare OR take 1 capsule of Heliocare Ultra. For prolonged exposure (such as a full day in the sun), you can repeat your dose of the supplement 4 hours after your first dose. Heliocare can be purchased at [facility] or at [email or web address].   Melanoma ABCDEs  Melanoma is the most dangerous type of skin cancer, and is the leading cause of death from skin disease.  You are more likely to develop melanoma if you:  Have light-colored skin, light-colored eyes, or red or blond hair  Spend a lot of time in the sun  Tan regularly, either outdoors or in a tanning bed  Have had blistering sunburns, especially during childhood  Have a close family member who has had a melanoma  Have atypical moles or large birthmarks  Early detection of melanoma is key since treatment is typically straightforward and cure rates are extremely high if we catch it early.   The first sign of melanoma is often a change in a mole or a new dark spot.  The ABCDE system is a way of remembering the signs of melanoma.  A for asymmetry:  The two halves do not match. B for border:  The edges of the growth are irregular. C for color:  A mixture of colors are present instead of an even brown color. D for diameter:  Melanomas are usually (but not always) greater than 34mm - the size of a pencil eraser. E for evolution:   The spot keeps changing in size, shape, and color.  Please check your skin once per month between visits. You can use a small mirror in front and a large mirror behind you to keep an eye on the back side or your body.   If you see any new or changing lesions before your next follow-up, please call to schedule a visit.  Please continue daily skin protection including broad spectrum sunscreen SPF 30+ to sun-exposed areas, reapplying every 2 hours as needed when you're outdoors.   Recommend taking Heliocare sun protection supplement daily in sunny weather for additional sun protection. For maximum protection on the sunniest days, you can take up to 2 capsules of regular Heliocare OR take 1 capsule of Heliocare Ultra. For prolonged exposure (such as a full day in the sun), you can repeat your dose of the supplement 4 hours after your first dose. Heliocare can be purchased at [facility] or at [email or web address].   Recommend daily broad spectrum sunscreen SPF 30+ to sun-exposed areas, reapply every 2 hours as needed. Call for new or changing lesions.  Staying in the shade or wearing long sleeves, sun glasses (UVA+UVB protection) and wide brim hats (4-inch brim around the entire circumference of the hat) are also recommended for sun protection.   If you have any questions or concerns for your doctor, please call our main line at [phone] and press option 4 to reach your doctor's medical assistant. If no one answers,  please leave a voicemail as directed and we will return your call as soon as possible. Messages left after 4 pm will be answered the following business day.   You may also send [place] a message via [place]. We typically respond to MyChart messages within 1-2 business days.  For prescription refills, please ask your pharmacy to contact our office. Our fax number is [phone].  If you have an urgent issue when the clinic is closed that cannot wait until the next business day, you  can page your doctor at the number below.    Please note that while we do our best to be available for urgent issues outside of office hours, we are not available 24/7.   If you have an urgent issue and are unable to reach [place], you may choose to seek medical care at your doctor's office, retail clinic, urgent care center, or emergency room.  If you have a medical emergency, please immediately call 911 or go to the emergency department.  Pager Numbers  - Dr. Nehemiah Massed: [phone]  - Dr. Laurence Ferrari: [phone]  - Dr. Nicole Kindred: [phone]  In the event of inclement weather, please call our main line at [phone] for an update on the status of any delays or closures.  Dermatology Medication Tips: Please keep the boxes that topical medications come in in order to help keep track of the instructions about where and how to use these. Pharmacies typically print the medication instructions only on the boxes and not directly on the medication tubes.   If your medication is too expensive, please contact our office at [phone] option 4 or send [place] a message through [place].   We are unable to tell what your co-pay for medications will be in advance as this is different depending on your insurance coverage. However, we may be able to find a substitute medication at lower cost or fill out paperwork to get insurance to cover a needed medication.   If a prior authorization is required to get your medication covered by your insurance company, please allow [place] 1-2 business days to complete this process.  Drug prices often vary depending on where the prescription is filled and some pharmacies may offer cheaper prices.  The website www.goodrx.com contains coupons for medications through different pharmacies. The prices here do not account for what the cost may be with help from insurance (it may be cheaper with your insurance), but the website can give you the price if you did not use any insurance.  -  You can print the associated coupon and take it with your prescription to the pharmacy.  - You may also stop by our office during regular business hours and pick up a GoodRx coupon card.  - If you need your prescription sent electronically to a different pharmacy, notify our office through [facility] or by phone at [phone] option 4.

## 2021-01-07 ENCOUNTER — Encounter: Payer: Self-pay | Admitting: Dermatology

## 2021-04-19 DIAGNOSIS — E119 Type 2 diabetes mellitus without complications: Secondary | ICD-10-CM | POA: Insufficient documentation

## 2021-06-27 ENCOUNTER — Other Ambulatory Visit: Payer: Self-pay

## 2021-06-27 ENCOUNTER — Ambulatory Visit (INDEPENDENT_AMBULATORY_CARE_PROVIDER_SITE_OTHER): Payer: Self-pay | Admitting: Dermatology

## 2021-06-27 DIAGNOSIS — H01132 Eczematous dermatitis of right lower eyelid: Secondary | ICD-10-CM

## 2021-06-27 DIAGNOSIS — L578 Other skin changes due to chronic exposure to nonionizing radiation: Secondary | ICD-10-CM

## 2021-06-27 DIAGNOSIS — H01134 Eczematous dermatitis of left upper eyelid: Secondary | ICD-10-CM

## 2021-06-27 DIAGNOSIS — H01131 Eczematous dermatitis of right upper eyelid: Secondary | ICD-10-CM

## 2021-06-27 DIAGNOSIS — H01136 Eczematous dermatitis of left eye, unspecified eyelid: Secondary | ICD-10-CM

## 2021-06-27 DIAGNOSIS — H01135 Eczematous dermatitis of left lower eyelid: Secondary | ICD-10-CM

## 2021-06-27 DIAGNOSIS — L259 Unspecified contact dermatitis, unspecified cause: Secondary | ICD-10-CM

## 2021-06-27 DIAGNOSIS — H01133 Eczematous dermatitis of right eye, unspecified eyelid: Secondary | ICD-10-CM

## 2021-06-27 MED ORDER — HYDROCORTISONE 2.5 % EX CREA: TOPICAL_CREAM | Freq: Two times a day (BID) | CUTANEOUS | 0 refills | Status: AC

## 2021-06-27 NOTE — Patient Instructions (Addendum)
Start immediately after thanksgiving wait for 1 month if not totally smooth start another round.  January do 1 round of cream  - Start 5-fluorouracil/calcipotriene cream twice a day for 7 days to affected areas including chest . Prescription sent to [facility]. Patient advised they will receive an email to purchase the medication online and have it sent to their home. Patient provided with handout reviewing treatment course and side effects and advised to call or message [place] on MyChart with any concerns.    Instructions for [facility] Medications  One or more of your medications was sent to the [facility] mail order compounding pharmacy. You will receive an email from them and can purchase the medicine through that link. It will then be mailed to your home at the address you confirmed. If for any reason you do not receive an email from them, please check your spam folder. If you still do not find the email, please let us know. [facility] phone number is [phone].    5-Fluorouracil/Calcipotriene Patient Education   Actinic keratoses are the dry, red scaly spots on the skin caused by sun damage. A portion of these spots can turn into skin cancer with time, and treating them can help prevent development of skin cancer.   Treatment of these spots requires removal of the defective skin cells. There are various ways to remove actinic keratoses, including freezing with liquid nitrogen, treatment with creams, or treatment with a blue light procedure in the office.   5-fluorouracil cream is a topical cream used to treat actinic keratoses. It works by interfering with the growth of abnormal fast-growing skin cells, such as actinic keratoses. These cells peel off and are replaced by healthy ones.   5-fluorouracil/calcipotriene is a combination of the 5-fluorouracil cream with a vitamin D analog cream called calcipotriene. The calcipotriene alone does not treat  actinic keratoses. However, when it is combined with 5-fluorouracil, it helps the 5-fluorouracil treat the actinic keratoses much faster so that the same results can be achieved with a much shorter treatment time.  INSTRUCTIONS FOR 5-FLUOROURACIL/CALCIPOTRIENE CREAM:   5-fluorouracil/calcipotriene cream typically only needs to be used for 4-7 days. A thin layer should be applied twice a day to the treatment areas recommended by your physician.   If your physician prescribed you separate tubes of 5-fluourouracil and calcipotriene, apply a thin layer of 5-fluorouracil followed by a thin layer of calcipotriene.   Avoid contact with your eyes, nostrils, and mouth. Do not use 5-fluorouracil/calcipotriene cream on infected or open wounds.   You will develop redness, irritation and some crusting at areas where you have pre-cancer damage/actinic keratoses. IF YOU DEVELOP PAIN, BLEEDING, OR SIGNIFICANT CRUSTING, STOP THE TREATMENT EARLY - you have already gotten a good response and the actinic keratoses should clear up well.  Wash your hands after applying 5-fluorouracil 5% cream on your skin.   A moisturizer or sunscreen with a minimum SPF 30 should be applied each morning.   Once you have finished the treatment, you can apply a thin layer of Vaseline twice a day to irritated areas to soothe and calm the areas more quickly. If you experience significant discomfort, contact your physician.  For some patients it is necessary to repeat the treatment for best results.  SIDE EFFECTS: When using 5-fluorouracil/calcipotriene cream, you may have mild irritation, such as redness, dryness, swelling, or a mild burning sensation. This usually resolves within 2 weeks. The more actinic keratoses you have, the more redness  and inflammation you can expect during treatment. Eye irritation has been reported rarely. If this occurs, please let us know.  If you have any trouble using this cream, please call the office. If  you have any other questions about this information, please do not hesitate to ask me before you leave the office.  Actinic keratoses are precancerous spots that appear secondary to cumulative UV radiation exposure/sun exposure over time. They are chronic with expected duration over 1 year. A portion of actinic keratoses will progress to squamous cell carcinoma of the skin. It is not possible to reliably predict which spots will progress to skin cancer and so treatment is recommended to prevent development of skin cancer.   Recommend daily broad spectrum sunscreen SPF 30+ to sun-exposed areas, reapply every 2 hours as needed.  Recommend staying in the shade or wearing long sleeves, sun glasses (UVA+UVB protection) and wide brim hats (4-inch brim around the entire circumference of the hat). Call for new or changing lesions.   Recommend taking Heliocare sun protection supplement daily in sunny weather for additional sun protection. For maximum protection on the sunniest days, you can take up to 2 capsules of regular Heliocare OR take 1 capsule of Heliocare Ultra. For prolonged exposure (such as a full day in the sun), you can repeat your dose of the supplement 4 hours after your first dose. Heliocare can be purchased at [facility] or at [email or web address].    Recommend Niacinamide or Nicotinamide 500mg  twice per day to lower risk of non-melanoma skin cancer by approximately 25%. This is usually available at [employer].    For eyelid dermatitis  Use opzelura twice a day until clear. If unable to get Opzelura, use hydrocortisone 2.5% cream twice daily up to 2 weeks, then switch to elidel twice daily once calmed down. Switch to Opzelura if we can get covered or have sample.  Can apply a thin layer of moisturizer under the elidel to help with burning. Burning also improves after 2-3 days of use.     Melanoma ABCDEs  Melanoma is the most dangerous type of skin cancer, and is the leading  cause of death from skin disease.  You are more likely to develop melanoma if you: Have light-colored skin, light-colored eyes, or red or blond hair Spend a lot of time in the sun Tan regularly, either outdoors or in a tanning bed Have had blistering sunburns, especially during childhood Have a close family member who has had a melanoma Have atypical moles or large birthmarks  Early detection of melanoma is key since treatment is typically straightforward and cure rates are extremely high if we catch it early.   The first sign of melanoma is often a change in a mole or a new dark spot.  The ABCDE system is a way of remembering the signs of melanoma.  A for asymmetry:  The two halves do not match. B for border:  The edges of the growth are irregular. C for color:  A mixture of colors are present instead of an even brown color. D for diameter:  Melanomas are usually (but not always) greater than 60mm - the size of a pencil eraser. E for evolution:  The spot keeps changing in size, shape, and color.  Please check your skin once per month between visits. You can use a small mirror in front and a large mirror behind you to keep an eye on the back side or your body.   If you see  any new or changing lesions before your next follow-up, please call to schedule a visit.  Please continue daily skin protection including broad spectrum sunscreen SPF 30+ to sun-exposed areas, reapplying every 2 hours as needed when you're outdoors.   Staying in the shade or wearing long sleeves, sun glasses (UVA+UVB protection) and wide brim hats (4-inch brim around the entire circumference of the hat) are also recommended for sun protection.    If you have any questions or concerns for your doctor, please call our main line at [phone] and press option 4 to reach your doctor's medical assistant. If no one answers, please leave a voicemail as directed and we will return your call as soon as possible. Messages left  after 4 pm will be answered the following business day.   You may also send [place] a message via [place]. We typically respond to MyChart messages within 1-2 business days.  For prescription refills, please ask your pharmacy to contact our office. Our fax number is [phone].  If you have an urgent issue when the clinic is closed that cannot wait until the next business day, you can page your doctor at the number below.    Please note that while we do our best to be available for urgent issues outside of office hours, we are not available 24/7.   If you have an urgent issue and are unable to reach [place], you may choose to seek medical care at your doctor's office, retail clinic, urgent care center, or emergency room.  If you have a medical emergency, please immediately call 911 or go to the emergency department.  Pager Numbers  - Dr. Nehemiah Massed: [phone]  - Dr. Laurence Ferrari: [phone]  - Dr. Nicole Kindred: [phone]  In the event of inclement weather, please call our main line at [phone] for an update on the status of any delays or closures.  Dermatology Medication Tips: Please keep the boxes that topical medications come in in order to help keep track of the instructions about where and how to use these. Pharmacies typically print the medication instructions only on the boxes and not directly on the medication tubes.   If your medication is too expensive, please contact our office at [phone] option 4 or send [place] a message through [place].   We are unable to tell what your co-pay for medications will be in advance as this is different depending on your insurance coverage. However, we may be able to find a substitute medication at lower cost or fill out paperwork to get insurance to cover a needed medication.   If a prior authorization is required to get your medication covered by your insurance company, please allow [place] 1-2 business days to complete this process.  Drug prices often  vary depending on where the prescription is filled and some pharmacies may offer cheaper prices.  The website www.goodrx.com contains coupons for medications through different pharmacies. The prices here do not account for what the cost may be with help from insurance (it may be cheaper with your insurance), but the website can give you the price if you did not use any insurance.  - You can print the associated coupon and take it with your prescription to the pharmacy.  - You may also stop by our office during regular business hours and pick up a GoodRx coupon card.  - If you need your prescription sent electronically to a different pharmacy, notify our office through [facility] or by phone at [phone] option 4.

## 2021-06-27 NOTE — Progress Notes (Signed)
Follow-Up Visit   Subjective  Taylor Garrett is a 60 y.o. female who presents for the following: Follow-up (Patient here today she has some concerned with left eyelid irritation and some spots chest. //).  The following portions of the chart were reviewed this encounter and updated as appropriate:  Allergies  Meds  Problems  Med Hx  Surg Hx  Fam Hx      Review of Systems: No other skin or systemic complaints except as noted in HPI or Assessment and Plan.   Objective  Well appearing patient in no apparent distress; mood and affect are within normal limits.  A focused examination was performed including face, chest . Relevant physical exam findings are noted in the Assessment and Plan.  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 If unable to get Opzelura, use hydrocortisone 2.5% cream twice daily up to 2 weeks, then switch to elidel twice daily once calmed down. Switch to Opzelura if we can get covered or have sample.  Can apply a thin layer of moisturizer under the elidel to help with burning. Burning also improves after 2-3 days of use.   Topical steroids (such as triamcinolone, fluocinolone, fluocinonide, mometasone, clobetasol, halobetasol, betamethasone, hydrocortisone) can cause thinning and lightening of the skin if they are used for too long in the same area. Your physician has selected the right strength medicine for your problem and area affected on the body. Please use your medication only as directed by your physician to prevent side effects.    hydrocortisone 2.5 % cream - Left Upper Eyelid Apply topically 2 (two) times daily.  Actinic Damage - Severe, confluent actinic changes with pre-cancerous actinic keratoses  - Severe, chronic, not at goal, secondary to cumulative UV radiation exposure over time -  diffuse scaly erythematous macules and papules with underlying dyspigmentation - Discussed Prescription "Field Treatment" for Severe, Chronic Confluent Actinic Changes with Pre-Cancerous Actinic Keratoses Field treatment involves treatment of an entire area of skin that has confluent Actinic Changes (Sun/ Ultraviolet light damage) and PreCancerous Actinic Keratoses by method of PhotoDynamic Therapy (PDT) and/or prescription Topical Chemotherapy agents such as 5-fluorouracil, 5-fluorouracil/calcipotriene, and/or imiquimod.  The purpose is to decrease the number of clinically evident and subclinical PreCancerous lesions to prevent progression to development of skin cancer by chemically destroying early precancer changes that may or may not be visible.  It has been shown to reduce the risk of developing skin cancer in the treated area. As a result of treatment, redness, scaling, crusting, and open sores may occur during treatment course. One or more than one of these methods may be used and may have to be used several times to control, suppress and eliminate the PreCancerous changes. Discussed treatment course, expected reaction, and possible side effects. - Recommend daily broad spectrum sunscreen SPF 30+ to sun-exposed areas, reapply every 2 hours as needed.  - Staying in the shade or wearing long sleeves, sun glasses (UVA+UVB protection) and wide brim hats (4-inch brim around the entire circumference of the hat) are also recommended. - Call for new or changing lesions. - Has done PDT in past. Prefers topical at this time. - Start 5-fluorouracil/calcipotriene cream twice a day for 7 days to affected areas including chest . Prescription sent to [facility]. Patient advised they will receive an email to purchase the medication online and have  it sent to their home. Patient provided with handout reviewing treatment course and side effects and advised to call or message [place] on MyChart with  any concerns. Allow area to heal for 1 month. If still rough, repeat treatment.  Return for February ak follow up at chest , tbse . I, Ruthell Rummage, CMA, am acting as scribe for Forest Gleason, MD.  Documentation: I have reviewed the above documentation for accuracy and completeness, and I agree with the above.  Forest Gleason, MD

## 2021-07-03 ENCOUNTER — Encounter: Payer: Self-pay | Admitting: Dermatology

## 2021-09-07 DIAGNOSIS — N959 Unspecified menopausal and perimenopausal disorder: Secondary | ICD-10-CM | POA: Insufficient documentation

## 2021-09-27 ENCOUNTER — Ambulatory Visit: Payer: BC Managed Care – PPO | Admitting: Dermatology

## 2021-09-27 ENCOUNTER — Other Ambulatory Visit: Payer: Self-pay

## 2021-10-09 ENCOUNTER — Encounter: Payer: Self-pay | Admitting: Dermatology

## 2021-10-09 ENCOUNTER — Ambulatory Visit (INDEPENDENT_AMBULATORY_CARE_PROVIDER_SITE_OTHER): Payer: BC Managed Care – PPO | Admitting: Dermatology

## 2021-10-09 ENCOUNTER — Other Ambulatory Visit: Payer: Self-pay

## 2021-10-09 DIAGNOSIS — L814 Other melanin hyperpigmentation: Secondary | ICD-10-CM

## 2021-10-09 DIAGNOSIS — Z86007 Personal history of in-situ neoplasm of skin: Secondary | ICD-10-CM

## 2021-10-09 DIAGNOSIS — L578 Other skin changes due to chronic exposure to nonionizing radiation: Secondary | ICD-10-CM | POA: Diagnosis not present

## 2021-10-09 DIAGNOSIS — D492 Neoplasm of unspecified behavior of bone, soft tissue, and skin: Secondary | ICD-10-CM

## 2021-10-09 DIAGNOSIS — Z85828 Personal history of other malignant neoplasm of skin: Secondary | ICD-10-CM | POA: Diagnosis not present

## 2021-10-09 DIAGNOSIS — Z1283 Encounter for screening for malignant neoplasm of skin: Secondary | ICD-10-CM | POA: Diagnosis not present

## 2021-10-09 DIAGNOSIS — L820 Inflamed seborrheic keratosis: Secondary | ICD-10-CM | POA: Diagnosis not present

## 2021-10-09 DIAGNOSIS — L821 Other seborrheic keratosis: Secondary | ICD-10-CM

## 2021-10-09 DIAGNOSIS — L570 Actinic keratosis: Secondary | ICD-10-CM | POA: Diagnosis not present

## 2021-10-09 DIAGNOSIS — D1800 Hemangioma unspecified site: Secondary | ICD-10-CM

## 2021-10-09 DIAGNOSIS — C44519 Basal cell carcinoma of skin of other part of trunk: Secondary | ICD-10-CM | POA: Diagnosis not present

## 2021-10-09 DIAGNOSIS — C4491 Basal cell carcinoma of skin, unspecified: Secondary | ICD-10-CM

## 2021-10-09 DIAGNOSIS — D229 Melanocytic nevi, unspecified: Secondary | ICD-10-CM

## 2021-10-09 HISTORY — DX: Basal cell carcinoma of skin, unspecified: C44.91

## 2021-10-09 MED ORDER — CALCIPOTRIENE 0.005 % EX SOLN: CUTANEOUS | 0 refills | Status: AC

## 2021-10-09 MED ORDER — FLUOROURACIL 5 % EX SOLN: CUTANEOUS | 1 refills | Status: AC

## 2021-10-09 NOTE — Patient Instructions (Addendum)
Cryotherapy Aftercare  Wash gently with soap and water everyday.   Apply Vaseline and Band-Aid daily until healed.   Prior to procedure, discussed risks of blister formation, small wound, skin dyspigmentation, or rare scar following cryotherapy. Recommend Vaseline ointment to treated areas while healing.    Wound Care Instructions  Cleanse wound gently with soap and water once a day then pat dry with clean gauze. Apply a thing coat of Petrolatum (petroleum jelly, "Vaseline") over the wound (unless you have an allergy to this). We recommend that you use a new, sterile tube of Vaseline. Do not pick or remove scabs. Do not remove the yellow or white "healing tissue" from the base of the wound.  Cover the wound with fresh, clean, nonstick gauze and secure with paper tape. You may use Band-Aids in place of gauze and tape if the would is small enough, but would recommend trimming much of the tape off as there is often too much. Sometimes Band-Aids can irritate the skin.  You should call the office for your biopsy report after 1 week if you have not already been contacted.  If you experience any problems, such as abnormal amounts of bleeding, swelling, significant bruising, significant pain, or evidence of infection, please call the office immediately.  FOR ADULT SURGERY PATIENTS: If you need something for pain relief you may take 1 extra strength Tylenol (acetaminophen) AND 2 Ibuprofen (200mg  each) together every 4 hours as needed for pain. (do not take these if you are allergic to them or if you have a reason you should not take them.) Typically, you may only need pain medication for 1 to 3 days.   Scalp:  Start Fluorouracil 5% solution twice daily for 1 week  Start Calcipotriene solution twice daily for 1 week      Recommend taking Heliocare sun protection supplement daily in sunny weather for additional sun protection. For maximum protection on the sunniest days, you can take up to 2  capsules of regular Heliocare OR take 1 capsule of Heliocare Ultra. For prolonged exposure (such as a full day in the sun), you can repeat your dose of the supplement 4 hours after your first dose. Heliocare can be purchased at [employer], at some [facility] or at [email or web address].      Recommend daily broad spectrum sunscreen SPF 30+ to sun-exposed areas, reapply every 2 hours as needed. Call for new or changing lesions.  Staying in the shade or wearing long sleeves, sun glasses (UVA+UVB protection) and wide brim hats (4-inch brim around the entire circumference of the hat) are also recommended for sun protection.     Gentle Skin Care Guide  1. Bathe no more than once a day.  2. Avoid bathing in hot water  3. Use a mild soap like Dove, Vanicream, Cetaphil, CeraVe. Can use Lever 2000 or Cetaphil antibacterial soap  4. Use soap only where you need it. On most days, use it under your arms, between your legs, and on your feet. Let the water rinse other areas unless visibly dirty.  5. When you get out of the bath/shower, use a towel to gently blot your skin dry, don't rub it.  6. While your skin is still a little damp, apply a moisturizing cream such as Vanicream, CeraVe Cream, Cetaphil, Eucerin, Sarna lotion or plain Vaseline Jelly. For hands apply Neutrogena [place] Hand Cream or Excipial Hand Cream.  7. Reapply moisturizer any time you start to itch or feel dry.  8. Sometimes using free  and clear laundry detergents can be helpful. Fabric softener sheets should be avoided. Downy Free & Gentle liquid, or any liquid fabric softener that is free of dyes and perfumes, it acceptable to use  9. If your doctor has given you prescription creams you may apply moisturizers over them      Melanoma ABCDEs  Melanoma is the most dangerous type of skin cancer, and is the leading cause of death from skin disease.  You are more likely to develop melanoma if you: Have light-colored skin,  light-colored eyes, or red or blond hair Spend a lot of time in the sun Tan regularly, either outdoors or in a tanning bed Have had blistering sunburns, especially during childhood Have a close family member who has had a melanoma Have atypical moles or large birthmarks  Early detection of melanoma is key since treatment is typically straightforward and cure rates are extremely high if we catch it early.   The first sign of melanoma is often a change in a mole or a new dark spot.  The ABCDE system is a way of remembering the signs of melanoma.  A for asymmetry:  The two halves do not match. B for border:  The edges of the growth are irregular. C for color:  A mixture of colors are present instead of an even brown color. D for diameter:  Melanomas are usually (but not always) greater than 74mm - the size of a pencil eraser. E for evolution:  The spot keeps changing in size, shape, and color.  Please check your skin once per month between visits. You can use a small mirror in front and a large mirror behind you to keep an eye on the back side or your body.   If you see any new or changing lesions before your next follow-up, please call to schedule a visit.  Please continue daily skin protection including broad spectrum sunscreen SPF 30+ to sun-exposed areas, reapplying every 2 hours as needed when you're outdoors.   Staying in the shade or wearing long sleeves, sun glasses (UVA+UVB protection) and wide brim hats (4-inch brim around the entire circumference of the hat) are also recommended for sun protection.    If You Need Anything After Your Visit  If you have any questions or concerns for your doctor, please call our main line at [phone] and press option 4 to reach your doctor's medical assistant. If no one answers, please leave a voicemail as directed and we will return your call as soon as possible. Messages left after 4 pm will be answered the following business day.   You may  also send [place] a message via [place]. We typically respond to MyChart messages within 1-2 business days.  For prescription refills, please ask your pharmacy to contact our office. Our fax number is [phone].  If you have an urgent issue when the clinic is closed that cannot wait until the next business day, you can page your doctor at the number below.    Please note that while we do our best to be available for urgent issues outside of office hours, we are not available 24/7.   If you have an urgent issue and are unable to reach [place], you may choose to seek medical care at your doctor's office, retail clinic, urgent care center, or emergency room.  If you have a medical emergency, please immediately call 911 or go to the emergency department.  Pager Numbers  - Dr. Nehemiah Massed: [phone]  -  Dr. Laurence Ferrari: [phone]  - Dr. Nicole Kindred: [phone]  In the event of inclement weather, please call our main line at [phone] for an update on the status of any delays or closures.  Dermatology Medication Tips: Please keep the boxes that topical medications come in in order to help keep track of the instructions about where and how to use these. Pharmacies typically print the medication instructions only on the boxes and not directly on the medication tubes.   If your medication is too expensive, please contact our office at [phone] option 4 or send [place] a message through [place].   We are unable to tell what your co-pay for medications will be in advance as this is different depending on your insurance coverage. However, we may be able to find a substitute medication at lower cost or fill out paperwork to get insurance to cover a needed medication.   If a prior authorization is required to get your medication covered by your insurance company, please allow [place] 1-2 business days to complete this process.  Drug prices often vary depending on where the prescription is filled and some pharmacies  may offer cheaper prices.  The website www.goodrx.com contains coupons for medications through different pharmacies. The prices here do not account for what the cost may be with help from insurance (it may be cheaper with your insurance), but the website can give you the price if you did not use any insurance.  - You can print the associated coupon and take it with your prescription to the pharmacy.  - You may also stop by our office during regular business hours and pick up a GoodRx coupon card.  - If you need your prescription sent electronically to a different pharmacy, notify our office through [facility] or by phone at [phone] option 4.     Si Usted Necesita Algo Despus de Su Visita  Tambin puede enviarnos un mensaje a travs de Pharmacist, community. Por lo general respondemos a los mensajes de MyChart en el transcurso de 1 a 2 das hbiles.  Para renovar recetas, por favor pida a su farmacia que se ponga en contacto con nuestra oficina. Harland Dingwall de fax es [place] [phone].  Si tiene un asunto urgente cuando la clnica est cerrada y que no puede esperar hasta el siguiente da hbil, puede llamar/localizar a su doctor(a) al nmero que aparece a continuacin.   Por favor, tenga en cuenta que aunque hacemos todo lo posible para estar disponibles para asuntos urgentes fuera del horario de [place], no estamos disponibles las 24 horas del da, los 7 das de la [place].   Si tiene un problema urgente y no puede comunicarse con nosotros, puede optar por buscar atencin mdica  en el consultorio de su doctor(a), en una clnica privada, en un centro de atencin urgente o en una sala de emergencias.  Si tiene Engineering geologist, por favor llame inmediatamente al 911 o vaya a la sala de emergencias.  Nmeros de bper  - Dr. Nehemiah Massed: [phone]  - Dra. Moye: [phone]  - Dra. Nicole Kindred: [phone]  En caso de inclemencias del [place], por favor llame a Johnsie Kindred principal  al [phone] para una actualizacin sobre el [place] de cualquier retraso o cierre.  Consejos para la medicacin en dermatologa: Por favor, guarde las cajas en las que vienen los medicamentos de uso tpico para ayudarle a seguir las instrucciones sobre dnde y cmo usarlos. Las farmacias generalmente imprimen las instrucciones del medicamento slo en las cajas y  no directamente en los tubos del medicamento.   Si su medicamento es muy caro, por favor, pngase en contacto con Zigmund Daniel llamando al [phone] y presione la opcin 4 o envenos un mensaje a travs de Pharmacist, community.   No podemos decirle cul ser su copago por los medicamentos por adelantado ya que esto es diferente dependiendo de la cobertura de su seguro. Sin embargo, es posible que podamos encontrar un medicamento sustituto a Electrical engineer un formulario para que el seguro cubra el medicamento que se considera necesario.   Si se requiere una autorizacin previa para que su compaa de seguros [place] su medicamento, por favor permtanos de 1 a 2 das hbiles para completar este proceso.  Los precios de los medicamentos varan con frecuencia dependiendo del Environmental consultant de dnde se surte la receta y alguna farmacias pueden ofrecer precios ms baratos.  El sitio web www.goodrx.com tiene cupones para medicamentos de Airline pilot. Los precios aqu no tienen en cuenta lo que podra costar con la ayuda del seguro (puede ser ms barato con su seguro), pero el sitio web puede darle el precio si no utiliz Research scientist (physical sciences).  - Puede imprimir el cupn correspondiente y llevarlo con su receta a la farmacia.  - Tambin puede pasar por nuestra oficina durante el horario de atencin regular y Charity fundraiser una tarjeta de cupones de GoodRx.  - Si necesita que su receta se enve electrnicamente a una farmacia diferente, informe a nuestra oficina a travs de MyChart de [place] o por telfono llamando al [phone] y presione la opcin 4.

## 2021-10-09 NOTE — Progress Notes (Signed)
Follow-Up Visit   Subjective  Taylor Garrett is a 61 y.o. female who presents for the following: Annual Exam (Here for skin cancer screening. Full body. HxBCC, HxAK's. HxSCC) and Actinic Keratosis (Chest. S/P 5FU Calcipotriene treatment since last visit. Patient reports area was very irritated and inflamed).  The patient presents for Total-Body Skin Exam (TBSE) for skin cancer screening and mole check.  The patient has spots, moles and lesions to be evaluated, some may be new or changing and the patient has concerns that these could be cancer.   The following portions of the chart were reviewed this encounter and updated as appropriate:  Tobacco   Allergies   Meds   Problems   Med Hx   Surg Hx   Fam Hx       Review of Systems: No other skin or systemic complaints except as noted in HPI or Assessment and Plan.   Objective  Well appearing patient in no apparent distress; mood and affect are within normal limits.  A full examination was performed including scalp, head, eyes, ears, nose, lips, neck, chest, axillae, abdomen, back, buttocks, bilateral upper extremities, bilateral lower extremities, hands, feet, fingers, toes, fingernails, and toenails. All findings within normal limits unless otherwise noted below.  Right Thigh - Posterior x1, righ thigh lateral x1 (2) Erythematous keratotic or waxy stuck-on papule or plaque.  Left Upper Back 0.6cm pink papule       Assessment & Plan   Lentigines - Scattered tan macules - Due to sun exposure - Benign-appearing, observe - Recommend daily broad spectrum sunscreen SPF 30+ to sun-exposed areas, reapply every 2 hours as needed. - Call for any changes  Seborrheic Keratoses - Stuck-on, waxy, tan-brown papules and/or plaques  - Benign-appearing - Discussed benign etiology and prognosis. - Observe - Call for any changes  Melanocytic Nevi - Tan-brown and/or pink-flesh-colored symmetric macules and papules - Benign  appearing on exam today - Observation - Call clinic for new or changing moles - Recommend daily use of broad spectrum spf 30+ sunscreen to sun-exposed areas.   Hemangiomas - Red papules - Discussed benign nature - Observe - Call for any changes  Actinic Damage - Severe, confluent actinic changes with pre-cancerous actinic keratoses at scalp - Severe, chronic, not at goal, secondary to cumulative UV radiation exposure over time - diffuse scaly erythematous macules and papules with underlying dyspigmentation - Discussed Prescription "Field Treatment" for Severe, Chronic Confluent Actinic Changes with Pre-Cancerous Actinic Keratoses Field treatment involves treatment of an entire area of skin that has confluent Actinic Changes (Sun/ Ultraviolet light damage) and PreCancerous Actinic Keratoses by method of PhotoDynamic Therapy (PDT) and/or prescription Topical Chemotherapy agents such as 5-fluorouracil, 5-fluorouracil/calcipotriene, and/or imiquimod.  The purpose is to decrease the number of clinically evident and subclinical PreCancerous lesions to prevent progression to development of skin cancer by chemically destroying early precancer changes that may or may not be visible.  It has been shown to reduce the risk of developing skin cancer in the treated area. As a result of treatment, redness, scaling, crusting, and open sores may occur during treatment course. One or more than one of these methods may be used and may have to be used several times to control, suppress and eliminate the PreCancerous changes. Discussed treatment course, expected reaction, and possible side effects. - Recommend daily broad spectrum sunscreen SPF 30+ to sun-exposed areas, reapply every 2 hours as needed.  - Staying in the shade or wearing long sleeves, sun glasses (UVA+UVB  protection) and wide brim hats (4-inch brim around the entire circumference of the hat) are also recommended. - Call for new or changing lesions. -  start 5-fluorouracil solution and calcipotriene solution (both Rx) twice a day for 7 days. Patient provided with handout reviewing treatment course and side effects and advised to call or message [place] on MyChart with any concerns.  History of Basal Cell Carcinoma of the Skin - No evidence of recurrence today at right lateral foot - Recommend regular full body skin exams - Recommend daily broad spectrum sunscreen SPF 30+ to sun-exposed areas, reapply every 2 hours as needed.  - Call if any new or changing lesions are noted between office visits   History of Squamous Cell Carcinoma in situ of the Skin - No evidence of recurrence today at right plantar foot - No lymphadenopathy - Recommend regular full body skin exams - Recommend daily broad spectrum sunscreen SPF 30+ to sun-exposed areas, reapply every 2 hours as needed.  - Call if any new or changing lesions are noted between office visits     Skin cancer screening performed today.  Inflamed seborrheic keratosis (2) Right Thigh - Posterior x1, righ thigh lateral x1  Prior to procedure, discussed risks of blister formation, small wound, skin dyspigmentation, or rare scar following cryotherapy. Recommend Vaseline ointment to treated areas while healing.   Destruction of lesion - Right Thigh - Posterior x1, righ thigh lateral x1  Destruction method: cryotherapy   Informed consent: discussed and consent obtained   Lesion destroyed using liquid nitrogen: Yes   Outcome: patient tolerated procedure well with no complications   Post-procedure details: wound care instructions given    Related Medications triamcinolone cream (KENALOG) 0.1 % Apply 1 application topically 2 (two) times daily as needed.  AK (actinic keratosis)  Related Medications Fluorouracil 5 % SOLN Apply twice daily for 7 days to scalp  Calcipotriene 0.005 % solution Apply BID for 7 days to scalp  Neoplasm of skin Left Upper Back  Skin / nail biopsy Type of  biopsy: tangential   Informed consent: discussed and consent obtained   Anesthesia: the lesion was anesthetized in a standard fashion   Anesthesia comment:  Area prepped with alcohol Anesthetic:  1% lidocaine w/ epinephrine 1-100,000 buffered w/ 8.4% NaHCO3 Instrument used: flexible razor blade   Hemostasis achieved with: pressure, aluminum chloride and electrodesiccation   Outcome: patient tolerated procedure well   Post-procedure details: wound care instructions given   Post-procedure details comment:  Ointment and small bandage applied  Specimen 1 - Surgical pathology Differential Diagnosis: R/O BCC  Check Margins: No   Return in about 6 months (around 04/08/2022) for TBSE.  I, Emelia Salisbury, CMA, am acting as scribe for Forest Gleason, MD.  Documentation: I have reviewed the above documentation for accuracy and completeness, and I agree with the above.  Forest Gleason, MD

## 2021-10-10 ENCOUNTER — Telehealth: Payer: Self-pay

## 2021-10-10 NOTE — Telephone Encounter (Signed)
Patient advised bx results showed BCC, scheduled for EDC.  Lurlean Horns., RMA

## 2021-10-10 NOTE — Telephone Encounter (Signed)
-----   Message from Alfonso Patten, MD sent at 10/10/2021  5:14 PM EST ----- Skin , left upper back BASAL CELL CARCINOMA, NODULAR PATTERN, BASE INVOLVED --> ED&C  MAs please call to review and schedule ED&C. Thank you!

## 2021-10-30 ENCOUNTER — Ambulatory Visit: Payer: BC Managed Care – PPO | Admitting: Dermatology

## 2021-10-31 ENCOUNTER — Other Ambulatory Visit: Payer: Self-pay

## 2021-10-31 ENCOUNTER — Encounter: Payer: Self-pay | Admitting: Dermatology

## 2021-10-31 ENCOUNTER — Ambulatory Visit: Payer: BC Managed Care – PPO | Admitting: Dermatology

## 2021-10-31 DIAGNOSIS — C44519 Basal cell carcinoma of skin of other part of trunk: Secondary | ICD-10-CM

## 2021-10-31 DIAGNOSIS — L570 Actinic keratosis: Secondary | ICD-10-CM | POA: Diagnosis not present

## 2021-10-31 DIAGNOSIS — L578 Other skin changes due to chronic exposure to nonionizing radiation: Secondary | ICD-10-CM | POA: Diagnosis not present

## 2021-10-31 NOTE — Progress Notes (Signed)
? ?Follow-Up Visit ?  ?Subjective  ?Taylor Garrett is a 61 y.o. female who presents for the following: Skin Cancer (Bx proven BCC. Here for [facility]. BX 10/09/2021).  She also has questions about starting field treatment for scalp.  Has not started treatment yet.  ? ? ? ?The following portions of the chart were reviewed this encounter and updated as appropriate:   ?  ? ?Review of Systems: No other skin or systemic complaints except as noted in HPI or Assessment and Plan. ? ? ?Objective  ?Well appearing patient in no apparent distress; mood and affect are within normal limits. ? ?A focused examination was performed including back. Relevant physical exam findings are noted in the Assessment and Plan. ? ?Left upper back ?Pink healing biopsy site ? ? ?Assessment & Plan  ?Basal cell carcinoma (BCC) of skin of other part of torso ?Left upper back ? ?Destruction of lesion ? ?Destruction method: electrodesiccation and curettage   ?Informed consent: discussed and consent obtained   ?Timeout:  patient name, date of birth, surgical site, and procedure verified ?Patient was prepped and draped in usual sterile fashion: prepped with alcohol. ?Anesthesia: the lesion was anesthetized in a standard fashion   ?Anesthetic:  1% lidocaine w/ epinephrine 1-100,000 buffered w/ 8.4% NaHCO3 ?Curettage performed in three different directions: Yes   ?Electrodesiccation performed over the curetted area: Yes   ?Final wound size (cm):  1.1 ?Hemostasis achieved with:  pressure, aluminum chloride and electrodesiccation ?Outcome: patient tolerated procedure well with no complications   ?Post-procedure details: wound care instructions given   ?Additional details:  Mupirocin ointment and Bandaid applied ?  ? ? 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 ?- Severe, chronic, not at goal, secondary to cumulative UV radiation exposure over time ?- diffuse scaly erythematous macules and papules  with underlying dyspigmentation ?- Discussed Prescription "Field Treatment" for Severe, Chronic Confluent Actinic Changes with Pre-Cancerous Actinic Keratoses ?Field treatment involves treatment of an entire area of skin that has confluent Actinic Changes (Sun/ Ultraviolet light damage) and PreCancerous Actinic Keratoses by method of PhotoDynamic Therapy (PDT) and/or prescription Topical Chemotherapy agents such as 5-fluorouracil, 5-fluorouracil/calcipotriene, and/or imiquimod.  The purpose is to decrease the number of clinically evident and subclinical PreCancerous lesions to prevent progression to development of skin cancer by chemically destroying early precancer changes that may or may not be visible.  It has been shown to reduce the risk of developing skin cancer in the treated area. As a result of treatment, redness, scaling, crusting, and open sores may occur during treatment course. One or more than one of these methods may be used and may have to be used several times to control, suppress and eliminate the PreCancerous changes. Discussed treatment course, expected reaction, and possible side effects. ?- Recommend daily broad spectrum sunscreen SPF 30+ to sun-exposed areas, reapply every 2 hours as needed.  ?- Staying in the shade or wearing long sleeves, sun glasses (UVA+UVB protection) and wide brim hats (4-inch brim around the entire circumference of the hat) are also recommended. ?- Call for new or changing lesions.  ?-  Has not started treatment yet.  Start 5-fluorouracil solution and calcipotriene solution (both Rx) twice a day for 7 days.  Reviewed with patient today. ? ? ?Return for TBSE As Scheduled. ? ? ?I, Emelia Salisbury, CMA, am acting as scribe for Brendolyn Patty, MD. ? ?Documentation: I have reviewed the above documentation for accuracy and completeness, and I agree with the  above. ? ?Brendolyn Patty MD  ? ? ?

## 2021-10-31 NOTE — Patient Instructions (Addendum)
Wound Care Instructions ? ?Cleanse wound gently with soap and water once a day then pat dry with clean gauze. Apply a thing coat of Petrolatum (petroleum jelly, "Vaseline") over the wound (unless you have an allergy to this). We recommend that you use a new, sterile tube of Vaseline. Do not pick or remove scabs. Do not remove the yellow or white "healing tissue" from the base of the wound. ? ?Cover the wound with fresh, clean, nonstick gauze and secure with paper tape. You may use Band-Aids in place of gauze and tape if the would is small enough, but would recommend trimming much of the tape off as there is often too much. Sometimes Band-Aids can irritate the skin. ? ?You should call the office for your biopsy report after 1 week if you have not already been contacted. ? ?If you experience any problems, such as abnormal amounts of bleeding, swelling, significant bruising, significant pain, or evidence of infection, please call the office immediately. ? ?FOR ADULT SURGERY PATIENTS: If you need something for pain relief you may take 1 extra strength Tylenol (acetaminophen) AND 2 Ibuprofen ('200mg'$  each) together every 4 hours as needed for pain. (do not take these if you are allergic to them or if you have a reason you should not take them.) Typically, you may only need pain medication for 1 to 3 days.  ? ? ? ?Start 5-fluorouracil solution and calcipotriene solution (both Rx) twice a day for 7 days to scalp.  ? ? ? ?If You Need Anything After Your Visit ? ?If you have any questions or concerns for your doctor, please call our main line at [phone] and press option 4 to reach your doctor's medical assistant. If no one answers, please leave a voicemail as directed and we will return your call as soon as possible. Messages left after 4 pm will be answered the following business day.  ? ?You may also send [place] a message via MyChart. We typically respond to MyChart messages within 1-2 business days. ? ?For prescription  refills, please ask your pharmacy to contact our office. Our fax number is [phone]. ? ?If you have an urgent issue when the clinic is closed that cannot wait until the next business day, you can page your doctor at the number below.   ? ?Please note that while we do our best to be available for urgent issues outside of office hours, we are not available 24/7.  ? ?If you have an urgent issue and are unable to reach [place], you may choose to seek medical care at your doctor's office, retail clinic, urgent care center, or emergency room. ? ?If you have a medical emergency, please immediately call 911 or go to the emergency department. ? ?Pager Numbers ? ?- Dr. Nehemiah Massed: [phone] ? ?- Dr. Laurence Ferrari: [phone] ? ?- Dr. Nicole Kindred: [phone] ? ?In the event of inclement weather, please call our main line at [phone] for an update on the status of any delays or closures. ? ?Dermatology Medication Tips: ?Please keep the boxes that topical medications come in in order to help keep track of the instructions about where and how to use these. Pharmacies typically print the medication instructions only on the boxes and not directly on the medication tubes.  ? ?If your medication is too expensive, please contact our office at [phone] option 4 or send [place] a message through [place].  ? ?We are unable to tell what your co-pay for medications will be in advance as this is  different depending on your insurance coverage. However, we may be able to find a substitute medication at lower cost or fill out paperwork to get insurance to cover a needed medication.  ? ?If a prior authorization is required to get your medication covered by your insurance company, please allow [place] 1-2 business days to complete this process. ? ?Drug prices often vary depending on where the prescription is filled and some pharmacies may offer cheaper prices. ? ?The website www.goodrx.com contains coupons for medications through different pharmacies. The  prices here do not account for what the cost may be with help from insurance (it may be cheaper with your insurance), but the website can give you the price if you did not use any insurance.  ?- You can print the associated coupon and take it with your prescription to the pharmacy.  ?- You may also stop by our office during regular business hours and pick up a GoodRx coupon card.  ?- If you need your prescription sent electronically to a different pharmacy, notify our office through [facility] or by phone at [phone] option 4. ? ? ? ? ?Si Usted Necesita Algo Despu?s de Su Visita ? ?Tambi?n puede enviarnos un mensaje a trav?s de MyChart. Por lo general respondemos a los mensajes de MyChart en el transcurso de 1 a 2 d?as h?biles. ? ?Para renovar recetas, por favor pida a su farmacia que se ponga en contacto con nuestra oficina. Nuestro n?mero de fax es el [phone]. ? ?Si tiene un asunto urgente cuando la cl?nica est? cerrada y que no puede esperar hasta el siguiente d?a h?bil, puede llamar/localizar a su doctor(a) al n?mero que aparece a continuaci?n.  ? ?Por favor, tenga en cuenta que aunque hacemos todo lo posible para estar disponibles para asuntos urgentes fuera del horario de oficina, no estamos disponibles las 24 horas del d?a, los 7 d?as de la semana.  ? ?Si tiene un problema urgente y no puede comunicarse con nosotros, puede optar por buscar atenci?n m?dica  en el consultorio de su doctor(a), en una cl?nica privada, en un centro de atenci?n urgente o en una sala de emergencias. ? ?Si tiene Engineer, maintenance (IT) m?dica, por favor llame inmediatamente al 911 o vaya a la sala de emergencias. ? ?N?meros de b?per ? ?- Dr. Nehemiah Massed: [phone] ? ?- Dra. Moye: [phone] ? ?- Dra. Nicole Kindred: [phone] ? ?En caso de inclemencias del tiempo, por favor llame a nuestra l?nea principal al [phone] para una actualizaci?n sobre el estado de cualquier retraso o cierre. ? ?Consejos para la medicaci?n en  dermatolog?a: ?Por favor, guarde las cajas en las que vienen los medicamentos de uso t?pico para ayudarle a seguir las instrucciones sobre d?nde y c?mo usarlos. Las farmacias generalmente imprimen las instrucciones del medicamento s?lo en las cajas y no directamente en los tubos del [place].  ? ?Si su medicamento es muy caro, por favor, p?ngase en contacto con Zigmund Daniel llamando al [phone] y presione la opci?n 4 o env?enos un mensaje a trav?s de MyChart.  ? ?No podemos decirle cu?l ser? su copago por los medicamentos por adelantado ya que esto es diferente dependiendo de la cobertura de su seguro. Sin embargo, es posible que podamos encontrar un medicamento sustituto a Electrical engineer un formulario para que el seguro cubra el medicamento que se considera necesario.  ? ?Si se requiere Ardelia Mems autorizaci?n previa para que su compa??a de seguros [place] su medicamento, por favor perm?tanos de 1 a 2 d?as h?biles para completar  este proceso. ? ?Los precios de los medicamentos var?an con frecuencia dependiendo del Environmental consultant de d?nde se surte la receta y alguna farmacias pueden ofrecer precios m?s baratos. ? ?El sitio web www.goodrx.com tiene cupones para medicamentos de Airline pilot. Los precios aqu? no tienen en cuenta lo que podr?a costar con la ayuda del seguro (puede ser m?s barato con su seguro), pero el sitio web puede darle el precio si no utiliz? ning?n seguro.  ?- Puede imprimir el cup?n correspondiente y llevarlo con su receta a la farmacia.  ?- Tambi?n puede pasar por nuestra oficina durante el horario de atenci?n regular y recoger una tarjeta de cupones de GoodRx.  ?- Si necesita que su receta se env?e electr?nicamente a Chiropodist, informe a nuestra oficina a trav?s de MyChart de [place] o por tel?fono llamando al [phone] y presione la opci?n 4.  ?

## 2022-03-01 DIAGNOSIS — R399 Unspecified symptoms and signs involving the genitourinary system: Secondary | ICD-10-CM | POA: Diagnosis not present

## 2022-03-01 DIAGNOSIS — I83811 Varicose veins of right lower extremities with pain: Secondary | ICD-10-CM | POA: Diagnosis not present

## 2022-03-01 DIAGNOSIS — I2510 Atherosclerotic heart disease of native coronary artery without angina pectoris: Secondary | ICD-10-CM | POA: Diagnosis not present

## 2022-03-01 DIAGNOSIS — E7800 Pure hypercholesterolemia, unspecified: Secondary | ICD-10-CM | POA: Diagnosis not present

## 2022-03-01 DIAGNOSIS — I10 Essential (primary) hypertension: Secondary | ICD-10-CM | POA: Diagnosis not present

## 2022-03-19 ENCOUNTER — Ambulatory Visit: Payer: 59 | Admitting: Dermatology

## 2022-03-19 DIAGNOSIS — L578 Other skin changes due to chronic exposure to nonionizing radiation: Secondary | ICD-10-CM

## 2022-03-19 DIAGNOSIS — D2372 Other benign neoplasm of skin of left lower limb, including hip: Secondary | ICD-10-CM | POA: Diagnosis not present

## 2022-03-19 DIAGNOSIS — D1801 Hemangioma of skin and subcutaneous tissue: Secondary | ICD-10-CM

## 2022-03-19 DIAGNOSIS — D492 Neoplasm of unspecified behavior of bone, soft tissue, and skin: Secondary | ICD-10-CM

## 2022-03-19 DIAGNOSIS — Z1283 Encounter for screening for malignant neoplasm of skin: Secondary | ICD-10-CM | POA: Diagnosis not present

## 2022-03-19 DIAGNOSIS — Z808 Family history of malignant neoplasm of other organs or systems: Secondary | ICD-10-CM

## 2022-03-19 DIAGNOSIS — D229 Melanocytic nevi, unspecified: Secondary | ICD-10-CM | POA: Diagnosis not present

## 2022-03-19 DIAGNOSIS — Z85828 Personal history of other malignant neoplasm of skin: Secondary | ICD-10-CM | POA: Diagnosis not present

## 2022-03-19 DIAGNOSIS — C4441 Basal cell carcinoma of skin of scalp and neck: Secondary | ICD-10-CM

## 2022-03-19 DIAGNOSIS — L821 Other seborrheic keratosis: Secondary | ICD-10-CM

## 2022-03-19 DIAGNOSIS — L820 Inflamed seborrheic keratosis: Secondary | ICD-10-CM | POA: Diagnosis not present

## 2022-03-19 DIAGNOSIS — L814 Other melanin hyperpigmentation: Secondary | ICD-10-CM | POA: Diagnosis not present

## 2022-03-19 DIAGNOSIS — D1800 Hemangioma unspecified site: Secondary | ICD-10-CM | POA: Diagnosis not present

## 2022-03-19 DIAGNOSIS — D225 Melanocytic nevi of trunk: Secondary | ICD-10-CM

## 2022-03-19 DIAGNOSIS — D239 Other benign neoplasm of skin, unspecified: Secondary | ICD-10-CM

## 2022-03-19 NOTE — Progress Notes (Addendum)
Follow-Up Visit   Subjective  Taylor Garrett is a 61 y.o. female who presents for the following: Total-Body Skin Exam (TBSE) for skin cancer screening and mole check given her history of skin cancer.    The patient has spots, moles and lesions to be evaluated, some may be new or changing and the patient has concerns that these could be cancer. ). Patient has a hx of BCC and SCC. Fhx of Melanoma   The following portions of the chart were reviewed this encounter and updated as appropriate:   Tobacco  Allergies  Meds  Problems  Med Hx  Surg Hx  Fam Hx      Review of Systems:  No other skin or systemic complaints except as noted in HPI or Assessment and Plan.  Objective  Well appearing patient in no apparent distress; mood and affect are within normal limits.  A full examination was performed including scalp, head, eyes, ears, nose, lips, neck, chest, axillae, abdomen, back, buttocks, bilateral upper extremities, bilateral lower extremities, hands, feet, fingers, toes, fingernails, and toenails. All findings within normal limits unless otherwise noted below.  right arm proximal elbow 0.5 cm pink papule      right post auricular 0.8 cm extremely thin pink papule      Left Knee, Left lateral thigh Flesh colored papule   Left Knee - Anterior Firm pink/brown papulenodule with dimple sign.   left breast x 1, left upper abdomen x 1, left upper back x 1 (3) Stuck-on, waxy, tan-brown papules --Discussed benign etiology and prognosis.     Assessment & Plan  Neoplasm of skin (2) right arm proximal elbow  right post auricular  Skin / nail biopsy Type of biopsy: tangential   Informed consent: discussed and consent obtained   Patient was prepped and draped in usual sterile fashion: area prepped with alochol. Anesthesia: the lesion was anesthetized in a standard fashion   Anesthetic:  1% lidocaine w/ epinephrine 1-100,000 buffered w/ 8.4% NaHCO3 Instrument  used: flexible razor blade   Hemostasis achieved with: pressure, aluminum chloride and electrodesiccation   Outcome: patient tolerated procedure well   Post-procedure details: wound care instructions given   Post-procedure details comment:  Ointment and small bandage  Specimen 1 - Surgical pathology Differential Diagnosis: R/O BCC   Check Margins: No  Patient defers biopsy at the right proximal elbow today. She reports she scratched this area a few day ago, patient instructed to cover with a band aid and recheck at next office visit. Call for changes before follow-up.  Benign neoplasm of skin of lower limb, including hip, left (3) Left Knee; Left Knee - Anterior; Left lateral thigh  Favor traumatized dermatofibroma vs wart at the left knee Favor traumatized SK vs wart at the left lateral thigh  Both benign appearing, both symptomatic  Destruction of lesion - Left Knee - Anterior Complexity: simple   Destruction method: cryotherapy   Informed consent: discussed and consent obtained   Timeout:  patient name, date of birth, surgical site, and procedure verified Lesion destroyed using liquid nitrogen: Yes   Region frozen until ice ball extended beyond lesion: Yes   Outcome: patient tolerated procedure well with no complications   Post-procedure details: wound care instructions given    Destruction of lesion - Left Knee, Left lateral thigh  Destruction method: cryotherapy   Informed consent: discussed and consent obtained   Lesion destroyed using liquid nitrogen: Yes   Region frozen until ice ball extended beyond lesion: Yes  Outcome: patient tolerated procedure well with no complications   Post-procedure details: wound care instructions given    Inflamed seborrheic keratosis (3) left breast x 1, left upper abdomen x 1, left upper back x 1  Symptomatic, irritating, patient would like treated.   Destruction of lesion - left breast x 1, left upper abdomen x 1, left upper back x  1  Destruction method: cryotherapy   Informed consent: discussed and consent obtained   Lesion destroyed using liquid nitrogen: Yes   Cryotherapy cycles:  2 Outcome: patient tolerated procedure well with no complications   Post-procedure details: wound care instructions given    Skin cancer screening  Actinic skin damage  Hemangioma of skin  History of skin cancer  Melanocytic nevi of trunk  Lentigines  Family history of melanoma   Lentigines - Scattered tan macules - Due to sun exposure - Benign-appearing, observe - Recommend daily broad spectrum sunscreen SPF 30+ to sun-exposed areas, reapply every 2 hours as needed. - Call for any changes  Seborrheic Keratoses - Stuck-on, waxy, tan-brown papules and/or plaques  - Benign-appearing - Discussed benign etiology and prognosis. - Observe - Call for any changes  Melanocytic Nevi - Tan-brown and/or pink-flesh-colored symmetric macules and papules - Benign appearing on exam today - Observation - Call clinic for new or changing moles - Recommend daily use of broad spectrum spf 30+ sunscreen to sun-exposed areas.   Hemangiomas - Red papules - Discussed benign nature - Observe - Call for any changes  Actinic Damage - Chronic condition, secondary to cumulative UV/sun exposure - diffuse scaly erythematous macules with underlying dyspigmentation - Recommend daily broad spectrum sunscreen SPF 30+ to sun-exposed areas, reapply every 2 hours as needed.  - Staying in the shade or wearing long sleeves, sun glasses (UVA+UVB protection) and wide brim hats (4-inch brim around the entire circumference of the hat) are also recommended for sun protection.  - Call for new or changing lesions.  History of Basal Cell Carcinoma of the Skin Right lateral foot 09/26/2020 left upper back 10/09/2021 - No evidence of recurrence today - Recommend regular full body skin exams - Recommend daily broad spectrum sunscreen SPF 30+ to sun-exposed  areas, reapply every 2 hours as needed.  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  - Call if any new or changing lesions are noted between office visits    Skin cancer screening performed today.   Return in about 3 months (around 06/19/2022) for recheck right arm, 6 months TBSE .  I, Marye Round, CMA, am acting as scribe for Forest Gleason, MD .   Documentation: I have reviewed the above documentation for accuracy and completeness, and I agree with the above.  Forest Gleason, MD

## 2022-03-19 NOTE — Patient Instructions (Addendum)
Recommend taking Heliocare sun protection supplement daily in sunny weather for additional sun protection. For maximum protection on the sunniest days, you can take up to 2 capsules of regular Heliocare OR take 1 capsule of Heliocare Ultra. For prolonged exposure (such as a full day in the sun), you can repeat your dose of the supplement 4 hours after your first dose. Heliocare can be purchased at [employer], at some [facility] or at [email or web address].      Wound Care Instructions  Cleanse wound gently with soap and water once a day then pat dry with clean gauze. Apply a thing coat of Petrolatum (petroleum jelly, "Vaseline") over the wound (unless you have an allergy to this). We recommend that you use a new, sterile tube of Vaseline. Do not pick or remove scabs. Do not remove the yellow or white "healing tissue" from the base of the wound.  Cover the wound with fresh, clean, nonstick gauze and secure with paper tape. You may use Band-Aids in place of gauze and tape if the would is small enough, but would recommend trimming much of the tape off as there is often too much. Sometimes Band-Aids can irritate the skin.  You should call the office for your biopsy report after 1 week if you have not already been contacted.  If you experience any problems, such as abnormal amounts of bleeding, swelling, significant bruising, significant pain, or evidence of infection, please call the office immediately.  FOR ADULT SURGERY PATIENTS: If you need something for pain relief you may take 1 extra strength Tylenol (acetaminophen) AND 2 Ibuprofen ('200mg'$  each) together every 4 hours as needed for pain. (do not take these if you are allergic to them or if you have a reason you should not take them.) Typically, you may only need pain medication for 1 to 3 days.       Cryotherapy Aftercare  Wash gently with soap and water everyday.   Apply Vaseline and Band-Aid daily until healed.    Due to recent  changes in healthcare laws, you may see results of your pathology and/or laboratory studies on MyChart before the doctors have had a chance to review them. We understand that in some cases there may be results that are confusing or concerning to you. Please understand that not all results are received at the same time and often the doctors may need to interpret multiple results in order to provide you with the best plan of care or course of treatment. Therefore, we ask that you please give [place] 2 business days to thoroughly review all your results before contacting the office for clarification. Should we see a critical lab result, you will be contacted sooner.   If You Need Anything After Your Visit  If you have any questions or concerns for your doctor, please call our main line at [phone] and press option 4 to reach your doctor's medical assistant. If no one answers, please leave a voicemail as directed and we will return your call as soon as possible. Messages left after 4 pm will be answered the following business day.   You may also send [place] a message via [place]. We typically respond to MyChart messages within 1-2 business days.  For prescription refills, please ask your pharmacy to contact our office. Our fax number is [phone].  If you have an urgent issue when the clinic is closed that cannot wait until the next business day, you can page your doctor at the number below.  Please note that while we do our best to be available for urgent issues outside of office hours, we are not available 24/7.   If you have an urgent issue and are unable to reach [place], you may choose to seek medical care at your doctor's office, retail clinic, urgent care center, or emergency room.  If you have a medical emergency, please immediately call 911 or go to the emergency department.  Pager Numbers  - Dr. Nehemiah Massed: [phone]  - Dr. Laurence Ferrari: [phone]  - Dr. Nicole Kindred: [phone]  In the event of  inclement weather, please call our main line at [phone] for an update on the status of any delays or closures.  Dermatology Medication Tips: Please keep the boxes that topical medications come in in order to help keep track of the instructions about where and how to use these. Pharmacies typically print the medication instructions only on the boxes and not directly on the medication tubes.   If your medication is too expensive, please contact our office at [phone] option 4 or send [place] a message through [place].   We are unable to tell what your co-pay for medications will be in advance as this is different depending on your insurance coverage. However, we may be able to find a substitute medication at lower cost or fill out paperwork to get insurance to cover a needed medication.   If a prior authorization is required to get your medication covered by your insurance company, please allow [place] 1-2 business days to complete this process.  Drug prices often vary depending on where the prescription is filled and some pharmacies may offer cheaper prices.  The website www.goodrx.com contains coupons for medications through different pharmacies. The prices here do not account for what the cost may be with help from insurance (it may be cheaper with your insurance), but the website can give you the price if you did not use any insurance.  - You can print the associated coupon and take it with your prescription to the pharmacy.  - You may also stop by our office during regular business hours and pick up a GoodRx coupon card.  - If you need your prescription sent electronically to a different pharmacy, notify our office through [facility] or by phone at [phone] option 4.     Si Usted Necesita Algo Despus de Su Visita  Tambin puede enviarnos un mensaje a travs de Pharmacist, community. Por lo general respondemos a los mensajes de MyChart en el transcurso de 1 a 2 das hbiles.  Para renovar  recetas, por favor pida a su farmacia que se ponga en contacto con nuestra oficina. Harland Dingwall de fax es [place] [phone].  Si tiene un asunto urgente cuando la clnica est cerrada y que no puede esperar hasta el siguiente da hbil, puede llamar/localizar a su doctor(a) al nmero que aparece a continuacin.   Por favor, tenga en cuenta que aunque hacemos todo lo posible para estar disponibles para asuntos urgentes fuera del horario de [place], no estamos disponibles las 24 horas del da, los 7 das de la [place].   Si tiene un problema urgente y no puede comunicarse con nosotros, puede optar por buscar atencin mdica  en el consultorio de su doctor(a), en una clnica privada, en un centro de atencin urgente o en una sala de emergencias.  Si tiene Engineering geologist, por favor llame inmediatamente al 911 o vaya a la sala de emergencias.  Nmeros de bper  - Dr. Nehemiah Massed:  [phone]  - Dra. Moye: [phone]  - Dra. Nicole Kindred: [phone]  En caso de inclemencias del [place], por favor llame a Johnsie Kindred principal al [phone] para una actualizacin sobre el [place] de cualquier retraso o cierre.  Consejos para la medicacin en dermatologa: Por favor, guarde las cajas en las que vienen los medicamentos de uso tpico para ayudarle a seguir las instrucciones sobre dnde y cmo usarlos. Las farmacias generalmente imprimen las instrucciones del medicamento slo en las cajas y no directamente en los tubos del [place].   Si su medicamento es muy caro, por favor, pngase en contacto con Zigmund Daniel llamando al [phone] y presione la opcin 4 o envenos un mensaje a travs de Pharmacist, community.   No podemos decirle cul ser su copago por los medicamentos por adelantado ya que esto es diferente dependiendo de la cobertura de su seguro. Sin embargo, es posible que podamos encontrar un medicamento sustituto a Electrical engineer un formulario para que el seguro cubra el medicamento  que se considera necesario.   Si se requiere una autorizacin previa para que su compaa de seguros [place] su medicamento, por favor permtanos de 1 a 2 das hbiles para completar este proceso.  Los precios de los medicamentos varan con frecuencia dependiendo del Environmental consultant de dnde se surte la receta y alguna farmacias pueden ofrecer precios ms baratos.  El sitio web www.goodrx.com tiene cupones para medicamentos de Airline pilot. Los precios aqu no tienen en cuenta lo que podra costar con la ayuda del seguro (puede ser ms barato con su seguro), pero el sitio web puede darle el precio si no utiliz Research scientist (physical sciences).  - Puede imprimir el cupn correspondiente y llevarlo con su receta a la farmacia.  - Tambin puede pasar por nuestra oficina durante el horario de atencin regular y Charity fundraiser una tarjeta de cupones de GoodRx.  - Si necesita que su receta se enve electrnicamente a una farmacia diferente, informe a nuestra oficina a travs de MyChart de [place] o por telfono llamando al [phone] y presione la opcin 4.

## 2022-03-20 ENCOUNTER — Encounter: Payer: Self-pay | Admitting: Dermatology

## 2022-03-21 ENCOUNTER — Telehealth: Payer: Self-pay

## 2022-03-21 NOTE — Telephone Encounter (Signed)
Patient advised bx results showed superficial BCC, pt scheduled for [facility] 04/09/22. Lurlean Horns., RMA

## 2022-03-21 NOTE — Telephone Encounter (Signed)
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 Thank you!

## 2022-04-09 ENCOUNTER — Encounter: Payer: Self-pay | Admitting: Dermatology

## 2022-04-09 ENCOUNTER — Ambulatory Visit: Payer: 59 | Admitting: Dermatology

## 2022-04-09 DIAGNOSIS — C4441 Basal cell carcinoma of skin of scalp and neck: Secondary | ICD-10-CM

## 2022-04-09 DIAGNOSIS — Z808 Family history of malignant neoplasm of other organs or systems: Secondary | ICD-10-CM

## 2022-04-09 MED ORDER — IMIQUIMOD 5 % EX CREA: TOPICAL_CREAM | CUTANEOUS | 2 refills | Status: AC

## 2022-04-09 NOTE — Patient Instructions (Addendum)
Start Imiquimod 5% cream: Apply once daily 5 days per week for 6 weeks  Reviewed expected reaction when using imiquimod cream, including irritation and mild inflammation and risk of erosions or more severe inflammation. Reviewed not to apply this in an area larger than 4 x 4 inches to avoid flu-like symptoms. Only a thin layer is required. Reviewed if too much irritation occurs, ensure application of only a thin layer and decrease frequency slightly to achieve a tolerable level of inflammation.    Your prescription was sent to [facility] in [place]. A representative from [facility] will contact you within 2 business hours to verify your address and insurance information to schedule a free delivery. If for any reason you do not receive a phone call from them, please reach out to them. Their phone number is [phone] and their hours are Monday-Friday 9:00 am-5:00 pm.      Recommend Niacinamide or Nicotinamide '500mg'$  twice per day to lower risk of non-melanoma skin cancer by approximately 25%. This is usually available at [employer].   Due to recent changes in healthcare laws, you may see results of your pathology and/or laboratory studies on MyChart before the doctors have had a chance to review them. We understand that in some cases there may be results that are confusing or concerning to you. Please understand that not all results are received at the same time and often the doctors may need to interpret multiple results in order to provide you with the best plan of care or course of treatment. Therefore, we ask that you please give [place] 2 business days to thoroughly review all your results before contacting the office for clarification. Should we see a critical lab result, you will be contacted sooner.   If You Need Anything After Your Visit  If you have any questions or concerns for your doctor, please call our main line at [phone] and press option 4 to reach your doctor's  medical assistant. If no one answers, please leave a voicemail as directed and we will return your call as soon as possible. Messages left after 4 pm will be answered the following business day.   You may also send [place] a message via [place]. We typically respond to MyChart messages within 1-2 business days.  For prescription refills, please ask your pharmacy to contact our office. Our fax number is [phone].  If you have an urgent issue when the clinic is closed that cannot wait until the next business day, you can page your doctor at the number below.    Please note that while we do our best to be available for urgent issues outside of office hours, we are not available 24/7.   If you have an urgent issue and are unable to reach [place], you may choose to seek medical care at your doctor's office, retail clinic, urgent care center, or emergency room.  If you have a medical emergency, please immediately call 911 or go to the emergency department.  Pager Numbers  - Dr. Nehemiah Massed: [phone]  - Dr. Laurence Ferrari: [phone]  - Dr. Nicole Kindred: [phone]  In the event of inclement weather, please call our main line at [phone] for an update on the status of any delays or closures.  Dermatology Medication Tips: Please keep the boxes that topical medications come in in order to help keep track of the instructions about where and how to use these. Pharmacies typically print the medication instructions only on the boxes and not directly on the  medication tubes.   If your medication is too expensive, please contact our office at [phone] option 4 or send [place] a message through [place].   We are unable to tell what your co-pay for medications will be in advance as this is different depending on your insurance coverage. However, we may be able to find a substitute medication at lower cost or fill out paperwork to get insurance to cover a needed medication.   If a prior authorization is required to  get your medication covered by your insurance company, please allow [place] 1-2 business days to complete this process.  Drug prices often vary depending on where the prescription is filled and some pharmacies may offer cheaper prices.  The website www.goodrx.com contains coupons for medications through different pharmacies. The prices here do not account for what the cost may be with help from insurance (it may be cheaper with your insurance), but the website can give you the price if you did not use any insurance.  - You can print the associated coupon and take it with your prescription to the pharmacy.  - You may also stop by our office during regular business hours and pick up a GoodRx coupon card.  - If you need your prescription sent electronically to a different pharmacy, notify our office through [facility] or by phone at [phone] option 4.     Si Usted Necesita Algo Despus de Su Visita  Tambin puede enviarnos un mensaje a travs de Pharmacist, community. Por lo general respondemos a los mensajes de MyChart en el transcurso de 1 a 2 das hbiles.  Para renovar recetas, por favor pida a su farmacia que se ponga en contacto con nuestra oficina. Harland Dingwall de fax es [place] [phone].  Si tiene un asunto urgente cuando la clnica est cerrada y que no puede esperar hasta el siguiente da hbil, puede llamar/localizar a su doctor(a) al nmero que aparece a continuacin.   Por favor, tenga en cuenta que aunque hacemos todo lo posible para estar disponibles para asuntos urgentes fuera del horario de [place], no estamos disponibles las 24 horas del da, los 7 das de la [place].   Si tiene un problema urgente y no puede comunicarse con nosotros, puede optar por buscar atencin mdica  en el consultorio de su doctor(a), en una clnica privada, en un centro de atencin urgente o en una sala de emergencias.  Si tiene Engineering geologist, por favor llame inmediatamente al 911 o vaya a la sala de  emergencias.  Nmeros de bper  - Dr. Nehemiah Massed: [phone]  - Dra. Moye: [phone]  - Dra. Nicole Kindred: [phone]  En caso de inclemencias del [place], por favor llame a Johnsie Kindred principal al [phone] para una actualizacin sobre el [place] de cualquier retraso o cierre.  Consejos para la medicacin en dermatologa: Por favor, guarde las cajas en las que vienen los medicamentos de uso tpico para ayudarle a seguir las instrucciones sobre dnde y cmo usarlos. Las farmacias generalmente imprimen las instrucciones del medicamento slo en las cajas y no directamente en los tubos del [place].   Si su medicamento es muy caro, por favor, pngase en contacto con Zigmund Daniel llamando al [phone] y presione la opcin 4 o envenos un mensaje a travs de Pharmacist, community.   No podemos decirle cul ser su copago por los medicamentos por adelantado ya que esto es diferente dependiendo de la cobertura de su seguro. Sin embargo, es posible que podamos encontrar un medicamento sustituto a Garment/textile technologist  costo o llenar un formulario para que el seguro cubra el medicamento que se considera necesario.   Si se requiere una autorizacin previa para que su compaa de seguros [place] su medicamento, por favor permtanos de 1 a 2 das hbiles para completar este proceso.  Los precios de los medicamentos varan con frecuencia dependiendo del Environmental consultant de dnde se surte la receta y alguna farmacias pueden ofrecer precios ms baratos.  El sitio web www.goodrx.com tiene cupones para medicamentos de Airline pilot. Los precios aqu no tienen en cuenta lo que podra costar con la ayuda del seguro (puede ser ms barato con su seguro), pero el sitio web puede darle el precio si no utiliz Research scientist (physical sciences).  - Puede imprimir el cupn correspondiente y llevarlo con su receta a la farmacia.  - Tambin puede pasar por nuestra oficina durante el horario de atencin regular y Charity fundraiser una tarjeta de cupones de GoodRx.  - Si  necesita que su receta se enve electrnicamente a una farmacia diferente, informe a nuestra oficina a travs de MyChart de [place] o por telfono llamando al [phone] y presione la opcin 4.

## 2022-04-09 NOTE — Progress Notes (Signed)
   Follow-Up Visit   Subjective  Taylor Garrett is a 61 y.o. female who presents for the following: Skin Cancer (Here for treatment of Bx proven BCC. Bx: 03/19/2022).   The following portions of the chart were reviewed this encounter and updated as appropriate:  Tobacco  Allergies  Meds  Problems  Med Hx  Surg Hx  Fam Hx      Review of Systems: No other skin or systemic complaints except as noted in HPI or Assessment and Plan.   Objective  Well appearing patient in no apparent distress; mood and affect are within normal limits.  A focused examination was performed including face/neck, right arm. Relevant physical exam findings are noted in the Assessment and Plan.  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 Not at goal (untreated cancer).   Pt has a high deductible plan and high cost for procedures  Start Imiquimod 5% cream: Apply once daily 5 days per week for 6 weeks. Reviewed that there is a slightly higher rate of recurrence compared to [facility].  Reviewed expected reaction when using imiquimod cream, including irritation and mild inflammation and risk of erosions or more severe inflammation. Reviewed not to apply this in an area larger than 4 x 4 inches to avoid flu-like symptoms. Only a thin layer is required. Reviewed if too much irritation occurs, ensure application of only a thin layer and decrease frequency slightly to achieve a tolerable level of inflammation.    Will treat with topical due to patient's high deductible insurance plan.    Family history of skin cancer  - what type(s): Melanoma    - who affected: Father  Return for TBSE As Scheduled, [facility].  I, Emelia Salisbury, CMA, am acting as scribe for Forest Gleason,  MD.  Documentation: I have reviewed the above documentation for accuracy and completeness, and I agree with the above.  Forest Gleason, MD

## 2022-04-10 ENCOUNTER — Ambulatory Visit: Payer: BC Managed Care – PPO | Admitting: Dermatology

## 2022-04-15 ENCOUNTER — Encounter: Payer: Self-pay | Admitting: Dermatology

## 2022-05-03 DIAGNOSIS — Z1231 Encounter for screening mammogram for malignant neoplasm of breast: Secondary | ICD-10-CM | POA: Diagnosis not present

## 2022-05-03 DIAGNOSIS — Z124 Encounter for screening for malignant neoplasm of cervix: Secondary | ICD-10-CM | POA: Diagnosis not present

## 2022-05-03 DIAGNOSIS — Z01419 Encounter for gynecological examination (general) (routine) without abnormal findings: Secondary | ICD-10-CM | POA: Diagnosis not present

## 2022-05-03 DIAGNOSIS — N760 Acute vaginitis: Secondary | ICD-10-CM | POA: Diagnosis not present

## 2022-05-03 DIAGNOSIS — B9689 Other specified bacterial agents as the cause of diseases classified elsewhere: Secondary | ICD-10-CM | POA: Diagnosis not present

## 2022-05-15 ENCOUNTER — Other Ambulatory Visit: Payer: Self-pay | Admitting: Obstetrics and Gynecology

## 2022-05-15 ENCOUNTER — Encounter: Payer: Self-pay | Admitting: Dermatology

## 2022-05-15 DIAGNOSIS — Z1231 Encounter for screening mammogram for malignant neoplasm of breast: Secondary | ICD-10-CM

## 2022-05-28 ENCOUNTER — Ambulatory Visit: Payer: 59

## 2022-05-31 DIAGNOSIS — R051 Acute cough: Secondary | ICD-10-CM | POA: Diagnosis not present

## 2022-05-31 DIAGNOSIS — J209 Acute bronchitis, unspecified: Secondary | ICD-10-CM | POA: Diagnosis not present

## 2022-05-31 DIAGNOSIS — Z6825 Body mass index (BMI) 25.0-25.9, adult: Secondary | ICD-10-CM | POA: Diagnosis not present

## 2022-06-20 ENCOUNTER — Ambulatory Visit: Payer: BC Managed Care – PPO | Admitting: Dermatology

## 2022-06-25 DIAGNOSIS — I83811 Varicose veins of right lower extremities with pain: Secondary | ICD-10-CM | POA: Diagnosis not present

## 2022-06-25 DIAGNOSIS — I872 Venous insufficiency (chronic) (peripheral): Secondary | ICD-10-CM | POA: Diagnosis not present

## 2022-07-02 ENCOUNTER — Ambulatory Visit
Admission: RE | Admit: 2022-07-02 | Discharge: 2022-07-02 | Disposition: A | Discharge status: Home / Self Care | Payer: 59 | Source: Ambulatory Visit | Attending: Obstetrics and Gynecology | Admitting: Obstetrics and Gynecology

## 2022-07-02 DIAGNOSIS — Z1231 Encounter for screening mammogram for malignant neoplasm of breast: Secondary | ICD-10-CM | POA: Diagnosis not present

## 2022-07-04 DIAGNOSIS — M797 Fibromyalgia: Secondary | ICD-10-CM | POA: Insufficient documentation

## 2022-07-04 DIAGNOSIS — K219 Gastro-esophageal reflux disease without esophagitis: Secondary | ICD-10-CM | POA: Diagnosis not present

## 2022-07-04 DIAGNOSIS — Z13220 Encounter for screening for lipoid disorders: Secondary | ICD-10-CM | POA: Diagnosis not present

## 2022-07-04 DIAGNOSIS — Z13228 Encounter for screening for other metabolic disorders: Secondary | ICD-10-CM | POA: Diagnosis not present

## 2022-07-04 DIAGNOSIS — E119 Type 2 diabetes mellitus without complications: Secondary | ICD-10-CM | POA: Diagnosis not present

## 2022-07-09 ENCOUNTER — Inpatient Hospital Stay
Admission: RE | Admit: 2022-07-09 | Discharge: 2022-07-09 | Disposition: A | Discharge status: Home / Self Care | Payer: Self-pay | Source: Ambulatory Visit | Attending: *Deleted | Admitting: *Deleted

## 2022-07-09 ENCOUNTER — Other Ambulatory Visit: Payer: Self-pay | Admitting: *Deleted

## 2022-07-09 DIAGNOSIS — Z1231 Encounter for screening mammogram for malignant neoplasm of breast: Secondary | ICD-10-CM

## 2022-07-26 DIAGNOSIS — Z809 Family history of malignant neoplasm, unspecified: Secondary | ICD-10-CM | POA: Diagnosis not present

## 2022-07-26 DIAGNOSIS — I10 Essential (primary) hypertension: Secondary | ICD-10-CM | POA: Diagnosis not present

## 2022-07-26 DIAGNOSIS — I2510 Atherosclerotic heart disease of native coronary artery without angina pectoris: Secondary | ICD-10-CM | POA: Diagnosis not present

## 2022-07-26 DIAGNOSIS — J45909 Unspecified asthma, uncomplicated: Secondary | ICD-10-CM | POA: Diagnosis not present

## 2022-07-26 DIAGNOSIS — Z823 Family history of stroke: Secondary | ICD-10-CM | POA: Diagnosis not present

## 2022-07-26 DIAGNOSIS — Z7951 Long term (current) use of inhaled steroids: Secondary | ICD-10-CM | POA: Diagnosis not present

## 2022-07-26 DIAGNOSIS — G4700 Insomnia, unspecified: Secondary | ICD-10-CM | POA: Diagnosis not present

## 2022-07-26 DIAGNOSIS — Z8249 Family history of ischemic heart disease and other diseases of the circulatory system: Secondary | ICD-10-CM | POA: Diagnosis not present

## 2022-07-26 DIAGNOSIS — E119 Type 2 diabetes mellitus without complications: Secondary | ICD-10-CM | POA: Diagnosis not present

## 2022-07-26 DIAGNOSIS — Z833 Family history of diabetes mellitus: Secondary | ICD-10-CM | POA: Diagnosis not present

## 2022-07-26 DIAGNOSIS — Z85828 Personal history of other malignant neoplasm of skin: Secondary | ICD-10-CM | POA: Diagnosis not present

## 2022-07-26 DIAGNOSIS — K219 Gastro-esophageal reflux disease without esophagitis: Secondary | ICD-10-CM | POA: Diagnosis not present

## 2022-07-30 DIAGNOSIS — Z03818 Encounter for observation for suspected exposure to other biological agents ruled out: Secondary | ICD-10-CM | POA: Diagnosis not present

## 2022-07-30 DIAGNOSIS — J45901 Unspecified asthma with (acute) exacerbation: Secondary | ICD-10-CM | POA: Diagnosis not present

## 2022-07-30 DIAGNOSIS — J209 Acute bronchitis, unspecified: Secondary | ICD-10-CM | POA: Diagnosis not present

## 2022-08-13 DIAGNOSIS — J205 Acute bronchitis due to respiratory syncytial virus: Secondary | ICD-10-CM | POA: Diagnosis not present

## 2022-08-13 DIAGNOSIS — Z03818 Encounter for observation for suspected exposure to other biological agents ruled out: Secondary | ICD-10-CM | POA: Diagnosis not present

## 2022-08-28 ENCOUNTER — Encounter: Payer: BC Managed Care – PPO | Admitting: Dermatology

## 2022-09-18 ENCOUNTER — Encounter: Payer: Self-pay | Admitting: Dermatology

## 2022-09-18 ENCOUNTER — Ambulatory Visit: Payer: 59 | Admitting: Dermatology

## 2022-09-18 DIAGNOSIS — L814 Other melanin hyperpigmentation: Secondary | ICD-10-CM | POA: Diagnosis not present

## 2022-09-18 DIAGNOSIS — D224 Melanocytic nevi of scalp and neck: Secondary | ICD-10-CM | POA: Diagnosis not present

## 2022-09-18 DIAGNOSIS — Z808 Family history of malignant neoplasm of other organs or systems: Secondary | ICD-10-CM | POA: Diagnosis not present

## 2022-09-18 DIAGNOSIS — L821 Other seborrheic keratosis: Secondary | ICD-10-CM | POA: Diagnosis not present

## 2022-09-18 DIAGNOSIS — Z1283 Encounter for screening for malignant neoplasm of skin: Secondary | ICD-10-CM

## 2022-09-18 DIAGNOSIS — L570 Actinic keratosis: Secondary | ICD-10-CM

## 2022-09-18 DIAGNOSIS — Z86007 Personal history of in-situ neoplasm of skin: Secondary | ICD-10-CM | POA: Diagnosis not present

## 2022-09-18 DIAGNOSIS — L578 Other skin changes due to chronic exposure to nonionizing radiation: Secondary | ICD-10-CM

## 2022-09-18 DIAGNOSIS — Z85828 Personal history of other malignant neoplasm of skin: Secondary | ICD-10-CM

## 2022-09-18 DIAGNOSIS — L649 Androgenic alopecia, unspecified: Secondary | ICD-10-CM

## 2022-09-18 DIAGNOSIS — D229 Melanocytic nevi, unspecified: Secondary | ICD-10-CM

## 2022-09-18 DIAGNOSIS — L219 Seborrheic dermatitis, unspecified: Secondary | ICD-10-CM

## 2022-09-18 NOTE — Progress Notes (Signed)
Follow-Up Visit   Subjective  Taylor Garrett is a 62 y.o. female who presents for the following: Annual Exam (1 yr tbse, hx of bcc, hx of aks, hx of isks, little sensitve spot at end of nose, spot at left corner of eye area. ).  The patient presents for Total-Body Skin Exam (TBSE) for skin cancer screening and mole check.  The patient has spots, moles and lesions to be evaluated, some may be new or changing and the patient has concerns that these could be cancer.   The following portions of the chart were reviewed this encounter and updated as appropriate:  Tobacco  Allergies  Meds  Problems  Med Hx  Surg Hx  Fam Hx      Review of Systems: No other skin or systemic complaints except as noted in HPI or Assessment and Plan.   Objective  Well appearing patient in no apparent distress; mood and affect are within normal limits.  A full examination was performed including scalp, head, eyes, ears, nose, lips, neck, chest, axillae, abdomen, back, buttocks, bilateral upper extremities, bilateral lower extremities, hands, feet, fingers, toes, fingernails, and toenails. All findings within normal limits unless otherwise noted below.  Right Parietal Scalp 0.7 cm x 0.4 cm pink papule   Nose Erythematous thin papules/macules with gritty scale.   Scalp Pink patches with greasy scale.    Assessment & Plan  Nevus Right Parietal Scalp  Benign-appearing. Stable compared to previous visit. Observation.  Call clinic for new or changing moles.  Recommend daily use of broad spectrum spf 30+ sunscreen to sun-exposed areas.    AK (actinic keratosis) Nose  Actinic keratoses are precancerous spots that appear secondary to cumulative UV radiation exposure/sun exposure over time. They are chronic with expected duration over 1 year. A portion of actinic keratoses will progress to squamous cell carcinoma of the skin. It is not possible to reliably predict which spots will progress to  skin cancer and so treatment is recommended to prevent development of skin cancer.  Recommend daily broad spectrum sunscreen SPF 30+ to sun-exposed areas, reapply every 2 hours as needed.  Recommend staying in the shade or wearing long sleeves, sun glasses (UVA+UVB protection) and wide brim hats (4-inch brim around the entire circumference of the hat). Call for new or changing lesions.  Start Fluorouracil 5% solution and Calcipotriene 0.005% solution twice daily for four days to the entire nose avoiding the crease, the left forehead above the brow, and the left temple. Apply one then the other. After four days switch to Vaseline until healed. If rough scaly lesions still present after one month then repeat treatment.   On the back of the right calf start Fluorouracil 5% solution and Calcipotriene 0.005% solution twice daily for seven days.  Reviewed course of treatment and expected reaction.  Patient advised to expect inflammation and crusting and advised that erosions are possible.  Patient advised to be diligent with sun protection during and after treatment. Handout with details of how to apply medication and what to expect provided. Counseled to keep medication out of reach of children and pets.   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  In women androgenetic alopecia is commonly associated with menopause but may occur any time after puberty.  It  causes hair thinning primarily on the crown with widening of the part and temporal hairline recession.    Patient deferred treatment at this time.   Seborrheic dermatitis Scalp  Not bothersome, defer treatment today.   Lentigines - Scattered tan macules - Due to sun exposure - Benign-appearing, observe -  Recommend daily broad spectrum sunscreen SPF 30+ to sun-exposed areas, reapply every 2 hours as needed. - Call for any changes  Telangiectasia Left cheek - Dilated blood vessel - Benign appearing on exam - Call for changes  Seborrheic Keratoses - Stuck-on, waxy, tan-brown papules and/or plaques  - Benign-appearing - Discussed benign etiology and prognosis. - Observe - Call for any changes  Melanocytic Nevi - Tan-brown and/or pink-flesh-colored symmetric macules and papules - Benign appearing on exam today - Observation - Call clinic for new or changing moles - Recommend daily use of broad spectrum spf 30+ sunscreen to sun-exposed areas.   Hemangiomas - Red papules - Discussed benign nature - Observe - Call for any changes  Actinic Damage - Chronic condition, secondary to cumulative UV/sun exposure. Monitor for recurrence of actinic damage at scalp. - diffuse scaly erythematous macules with underlying dyspigmentation - Recommend daily broad spectrum sunscreen SPF 30+ to sun-exposed areas, reapply every 2 hours as needed.  - Staying in the shade or wearing long sleeves, sun glasses (UVA+UVB protection) and wide brim hats (4-inch brim around the entire circumference of the hat) are also recommended for sun protection.  - Call for new or changing lesions.  History of Basal Cell Carcinoma of the Skin Right post auricular superficial 03/2022 And other multiple locations see history - No evidence of recurrence today - Recommend regular full body skin exams - Recommend daily broad spectrum sunscreen SPF 30+ to sun-exposed areas, reapply every 2 hours as needed.  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  - Call if any new or changing lesions are noted between office visits  Family history of skin cancer - what type(s):melanoma - who affected:father   Skin cancer screening performed today.  Return in about 6 months (around 03/19/2023).  I, Ruthell Rummage, CMA, am acting as scribe for Forest Gleason, MD.  Documentation: I have reviewed the above documentation for accuracy and completeness, and I agree with the above.  Forest Gleason, MD

## 2022-09-18 NOTE — Patient Instructions (Addendum)
Start Fluorouracil 5% solution and Calcipotriene 0.005% solution twice daily for four days to the entire nose avoiding the crease, the left forehead above the brow, and the left temple. Apply one then the other. After four days switch to Vaseline until healed. If rough scaly lesions still present after one month then repeat treatment.   On the back of the right calf start Fluorouracil 5% solution and Calcipotriene 0.005% solution twice daily for seven days.  Recommend Niacinamide or Nicotinamide '500mg'$  twice per day to lower risk of non-melanoma skin cancer by approximately 25%. This is usually available at [employer].  OR  Recommend taking Heliocare sun protection supplement daily in sunny weather for additional sun protection. For maximum protection on the sunniest days, you can take up to 2 capsules of regular Heliocare OR take 1 capsule of Heliocare Ultra. For prolonged exposure (such as a full day in the sun), you can repeat your dose of the supplement 4 hours after your first dose. Heliocare can be purchased at [employer], at some [facility] or at [email or web address].    5-Fluorouracil/Calcipotriene Patient Education   Actinic keratoses are the dry, red scaly spots on the skin caused by sun damage. A portion of these spots can turn into skin cancer with time, and treating them can help prevent development of skin cancer.   Treatment of these spots requires removal of the defective skin cells. There are various ways to remove actinic keratoses, including freezing with liquid nitrogen, treatment with creams, or treatment with a blue light procedure in the office.   5-fluorouracil cream is a topical cream used to treat actinic keratoses. It works by interfering with the growth of abnormal fast-growing skin cells, such as actinic keratoses. These cells peel off and are replaced by healthy ones. THIS CREAM SHOULD BE KEPT OUT OF REACH OF CHILDREN AND PETS AND SHOULD NOT BE USED BY PREGNANT  WOMEN.  5-fluorouracil/calcipotriene is a combination of the 5-fluorouracil cream with a vitamin D analog cream called calcipotriene. The calcipotriene alone does not treat actinic keratoses. However, when it is combined with 5-fluorouracil, it helps the 5-fluorouracil treat the actinic keratoses much faster so that the same results can be achieved with a much shorter treatment time.  INSTRUCTIONS FOR 5-FLUOROURACIL/CALCIPOTRIENE CREAM:   5-fluorouracil/calcipotriene cream typically only needs to be used for 4-7 days. A thin layer should be applied twice a day to the treatment areas recommended by your physician.   If your physician prescribed you separate tubes of 5-fluourouracil and calcipotriene, apply a thin layer of 5-fluorouracil followed by a thin layer of calcipotriene.   Avoid contact with your eyes or nostrils. Avoid applying the cream to your eyelids or lips unless directed to apply there by your physician. Do not use 5-fluorouracil/calcipotriene cream on infected or open wounds.   You will develop redness, irritation and some crusting at areas where you have pre-cancer damage/actinic keratoses. IF YOU DEVELOP PAIN, BLEEDING, OR SIGNIFICANT CRUSTING, STOP THE TREATMENT EARLY - you have already gotten a good response and the actinic keratoses should clear up well.  Wash your hands after applying 5-fluorouracil 5% cream on your skin.   A moisturizer or sunscreen with a minimum SPF 30 should be applied each morning.   Once you have finished the treatment, you can apply a thin layer of Vaseline twice a day to irritated areas to soothe and calm the areas more quickly. If you experience significant discomfort, contact your physician.  For some patients it is necessary to  repeat the treatment for best results.  SIDE EFFECTS: When using 5-fluorouracil/calcipotriene cream, you may have mild irritation, such as redness, dryness, swelling, or a mild burning sensation. This usually resolves  within 2 weeks. The more actinic keratoses you have, the more redness and inflammation you can expect during treatment. Eye irritation has been reported rarely. If this occurs, please let us know.   If you have any trouble using this cream, please send [place] a MyChart message or call the office. If you have any other questions about this information, please do not hesitate to ask me before you leave the office or contact me on MyChart or by phone.  Female Androgenic Alopecia is a chronic condition related to genetics and/or hormonal changes.  In women androgenetic alopecia is commonly associated with menopause but may occur any time after puberty.  It causes hair thinning primarily on the crown with widening of the part and temporal hairline recession.  Can use OTC Rogaine (minoxidil) 5% solution/foam as directed.  Oral treatments in female patients who have no contraindication may include : - Low dose oral minoxidil 1.25 - '5mg'$  daily - Spironolactone 50 - '100mg'$  bid - Finasteride 2.5 - 5 mg daily Adjunctive therapies include: - Low Level Laser Light Therapy (LLLT) - Platelet-rich plasma injections (PRP) - Hair Transplants or scalp reduction  Confirm with cardiologist before starting any new medications for hair loss.    Melanoma ABCDEs  Melanoma is the most dangerous type of skin cancer, and is the leading cause of death from skin disease.  You are more likely to develop melanoma if you: Have light-colored skin, light-colored eyes, or red or blond hair Spend a lot of time in the sun Tan regularly, either outdoors or in a tanning bed Have had blistering sunburns, especially during childhood Have a close family member who has had a melanoma Have atypical moles or large birthmarks  Early detection of melanoma is key since treatment is typically straightforward and cure rates are extremely high if we catch it early.   The first sign of melanoma is often a change in a mole or a new dark spot.   The ABCDE system is a way of remembering the signs of melanoma.  A for asymmetry:  The two halves do not match. B for border:  The edges of the growth are irregular. C for color:  A mixture of colors are present instead of an even brown color. D for diameter:  Melanomas are usually (but not always) greater than 38m - the size of a pencil eraser. E for evolution:  The spot keeps changing in size, shape, and color.  Please check your skin once per month between visits. You can use a small mirror in front and a large mirror behind you to keep an eye on the back side or your body.   If you see any new or changing lesions before your next follow-up, please call to schedule a visit.  Please continue daily skin protection including broad spectrum sunscreen SPF 30+ to sun-exposed areas, reapplying every 2 hours as needed when you're outdoors.   Staying in the shade or wearing long sleeves, sun glasses (UVA+UVB protection) and wide brim hats (4-inch brim around the entire circumference of the hat) are also recommended for sun protection.    Due to recent changes in healthcare laws, you may see results of your pathology and/or laboratory studies on MyChart before the doctors have had a chance to review them. We understand that in  some cases there may be results that are confusing or concerning to you. Please understand that not all results are received at the same time and often the doctors may need to interpret multiple results in order to provide you with the best plan of care or course of treatment. Therefore, we ask that you please give [place] 2 business days to thoroughly review all your results before contacting the office for clarification. Should we see a critical lab result, you will be contacted sooner.   If You Need Anything After Your Visit  If you have any questions or concerns for your doctor, please call our main line at [phone] and press option 4 to reach your doctor's medical assistant.  If no one answers, please leave a voicemail as directed and we will return your call as soon as possible. Messages left after 4 pm will be answered the following business day.   You may also send [place] a message via [place]. We typically respond to MyChart messages within 1-2 business days.  For prescription refills, please ask your pharmacy to contact our office. Our fax number is [phone].  If you have an urgent issue when the clinic is closed that cannot wait until the next business day, you can page your doctor at the number below.    Please note that while we do our best to be available for urgent issues outside of office hours, we are not available 24/7.   If you have an urgent issue and are unable to reach [place], you may choose to seek medical care at your doctor's office, retail clinic, urgent care center, or emergency room.  If you have a medical emergency, please immediately call 911 or go to the emergency department.  Pager Numbers  - Dr. Nehemiah Massed: [phone]  - Dr. Laurence Ferrari: [phone]  - Dr. Nicole Kindred: [phone]  In the event of inclement weather, please call our main line at [phone] for an update on the status of any delays or closures.  Dermatology Medication Tips: Please keep the boxes that topical medications come in in order to help keep track of the instructions about where and how to use these. Pharmacies typically print the medication instructions only on the boxes and not directly on the medication tubes.   If your medication is too expensive, please contact our office at [phone] option 4 or send [place] a message through [place].   We are unable to tell what your co-pay for medications will be in advance as this is different depending on your insurance coverage. However, we may be able to find a substitute medication at lower cost or fill out paperwork to get insurance to cover a needed medication.   If a prior authorization is required to get your medication  covered by your insurance company, please allow [place] 1-2 business days to complete this process.  Drug prices often vary depending on where the prescription is filled and some pharmacies may offer cheaper prices.  The website www.goodrx.com contains coupons for medications through different pharmacies. The prices here do not account for what the cost may be with help from insurance (it may be cheaper with your insurance), but the website can give you the price if you did not use any insurance.  - You can print the associated coupon and take it with your prescription to the pharmacy.  - You may also stop by our office during regular business hours and pick up a GoodRx coupon card.  - If you need your prescription  sent electronically to a different pharmacy, notify our office through [facility] or by phone at [phone] option 4.     Si Usted Necesita Algo Despus de Su Visita  Tambin puede enviarnos un mensaje a travs de Pharmacist, community. Por lo general respondemos a los mensajes de MyChart en el transcurso de 1 a 2 das hbiles.  Para renovar recetas, por favor pida a su farmacia que se ponga en contacto con nuestra oficina. Harland Dingwall de fax es [place] [phone].  Si tiene un asunto urgente cuando la clnica est cerrada y que no puede esperar hasta el siguiente da hbil, puede llamar/localizar a su doctor(a) al nmero que aparece a continuacin.   Por favor, tenga en cuenta que aunque hacemos todo lo posible para estar disponibles para asuntos urgentes fuera del horario de [place], no estamos disponibles las 24 horas del da, los 7 das de la [place].   Si tiene un problema urgente y no puede comunicarse con nosotros, puede optar por buscar atencin mdica  en el consultorio de su doctor(a), en una clnica privada, en un centro de atencin urgente o en una sala de emergencias.  Si tiene Engineering geologist, por favor llame inmediatamente al 911 o vaya a la sala de  emergencias.  Nmeros de bper  - Dr. Nehemiah Massed: [phone]  - Dra. Moye: [phone]  - Dra. Nicole Kindred: [phone]  En caso de inclemencias del [place], por favor llame a Johnsie Kindred principal al [phone] para una actualizacin sobre el [place] de cualquier retraso o cierre.  Consejos para la medicacin en dermatologa: Por favor, guarde las cajas en las que vienen los medicamentos de uso tpico para ayudarle a seguir las instrucciones sobre dnde y cmo usarlos. Las farmacias generalmente imprimen las instrucciones del medicamento slo en las cajas y no directamente en los tubos del [place].   Si su medicamento es muy caro, por favor, pngase en contacto con Zigmund Daniel llamando al [phone] y presione la opcin 4 o envenos un mensaje a travs de Pharmacist, community.   No podemos decirle cul ser su copago por los medicamentos por adelantado ya que esto es diferente dependiendo de la cobertura de su seguro. Sin embargo, es posible que podamos encontrar un medicamento sustituto a Electrical engineer un formulario para que el seguro cubra el medicamento que se considera necesario.   Si se requiere una autorizacin previa para que su compaa de seguros [place] su medicamento, por favor permtanos de 1 a 2 das hbiles para completar este proceso.  Los precios de los medicamentos varan con frecuencia dependiendo del Environmental consultant de dnde se surte la receta y alguna farmacias pueden ofrecer precios ms baratos.  El sitio web www.goodrx.com tiene cupones para medicamentos de Airline pilot. Los precios aqu no tienen en cuenta lo que podra costar con la ayuda del seguro (puede ser ms barato con su seguro), pero el sitio web puede darle el precio si no utiliz Research scientist (physical sciences).  - Puede imprimir el cupn correspondiente y llevarlo con su receta a la farmacia.  - Tambin puede pasar por nuestra oficina durante el horario de atencin regular y Charity fundraiser una tarjeta de cupones de GoodRx.  - Si  necesita que su receta se enve electrnicamente a una farmacia diferente, informe a nuestra oficina a travs de MyChart de  o por telfono llamando al [phone] y presione la opcin 4.

## 2022-09-19 ENCOUNTER — Encounter: Payer: BC Managed Care – PPO | Admitting: Dermatology

## 2023-03-05 ENCOUNTER — Ambulatory Visit: Payer: 59 | Admitting: Dermatology

## 2023-04-06 DIAGNOSIS — R300 Dysuria: Secondary | ICD-10-CM | POA: Insufficient documentation

## 2023-04-07 ENCOUNTER — Ambulatory Visit: Payer: 59 | Admitting: Dermatology

## 2023-06-06 ENCOUNTER — Other Ambulatory Visit: Payer: Self-pay | Admitting: Obstetrics and Gynecology

## 2023-06-06 DIAGNOSIS — Z1231 Encounter for screening mammogram for malignant neoplasm of breast: Secondary | ICD-10-CM

## 2023-06-06 DIAGNOSIS — Z780 Asymptomatic menopausal state: Secondary | ICD-10-CM

## 2023-07-09 ENCOUNTER — Ambulatory Visit
Admission: RE | Admit: 2023-07-09 | Discharge: 2023-07-09 | Disposition: A | Discharge status: Home / Self Care | Payer: 59 | Source: Ambulatory Visit | Attending: Obstetrics and Gynecology | Admitting: Obstetrics and Gynecology

## 2023-07-09 DIAGNOSIS — M8580 Other specified disorders of bone density and structure, unspecified site: Secondary | ICD-10-CM | POA: Insufficient documentation

## 2023-07-09 DIAGNOSIS — Z1231 Encounter for screening mammogram for malignant neoplasm of breast: Secondary | ICD-10-CM

## 2023-07-09 DIAGNOSIS — Z780 Asymptomatic menopausal state: Secondary | ICD-10-CM | POA: Insufficient documentation

## 2023-08-06 ENCOUNTER — Other Ambulatory Visit: Payer: Self-pay | Admitting: Medical Genetics

## 2023-08-07 ENCOUNTER — Other Ambulatory Visit
Admission: RE | Admit: 2023-08-07 | Discharge: 2023-08-07 | Disposition: A | Discharge status: Home / Self Care | Payer: Self-pay | Source: Ambulatory Visit | Attending: Medical Genetics | Admitting: Medical Genetics

## 2023-08-18 LAB — GENECONNECT MOLECULAR SCREEN: Genetic Analysis Overall Interpretation: NEGATIVE

## 2024-06-30 ENCOUNTER — Other Ambulatory Visit: Payer: Self-pay | Admitting: Family Medicine

## 2024-06-30 DIAGNOSIS — Z1231 Encounter for screening mammogram for malignant neoplasm of breast: Secondary | ICD-10-CM

## 2024-06-30 DIAGNOSIS — Z780 Asymptomatic menopausal state: Secondary | ICD-10-CM

## 2024-08-05 ENCOUNTER — Inpatient Hospital Stay: Admission: RE | Admit: 2024-08-05 | Discharge: 2024-08-05 | Attending: Family Medicine

## 2024-08-05 ENCOUNTER — Inpatient Hospital Stay: Admission: RE | Admit: 2024-08-05

## 2024-08-05 DIAGNOSIS — Z780 Asymptomatic menopausal state: Secondary | ICD-10-CM | POA: Insufficient documentation

## 2024-08-05 DIAGNOSIS — Z1231 Encounter for screening mammogram for malignant neoplasm of breast: Secondary | ICD-10-CM | POA: Diagnosis present
